# Patient Record
Sex: Male | Born: 1960
Health system: Southern US, Community
[De-identification: ages and names within clinical notes are randomized; demographics above are authoritative.]

## PROBLEM LIST (undated history)

## (undated) DIAGNOSIS — E119 Type 2 diabetes mellitus without complications: Secondary | ICD-10-CM

## (undated) DIAGNOSIS — I10 Essential (primary) hypertension: Secondary | ICD-10-CM

## (undated) DIAGNOSIS — E785 Hyperlipidemia, unspecified: Secondary | ICD-10-CM

## (undated) DIAGNOSIS — L219 Seborrheic dermatitis, unspecified: Secondary | ICD-10-CM

## (undated) HISTORY — DX: Essential (primary) hypertension: I10

## (undated) HISTORY — DX: Type 2 diabetes mellitus without complications: E11.9

## (undated) HISTORY — DX: Seborrheic dermatitis, unspecified: L21.9

## (undated) HISTORY — DX: Hyperlipidemia, unspecified: E78.5

---

## 2017-09-13 ENCOUNTER — Other Ambulatory Visit: Payer: Self-pay | Admitting: Internal Medicine

## 2017-09-13 ENCOUNTER — Encounter: Payer: Self-pay | Admitting: Nurse Practitioner

## 2017-09-13 ENCOUNTER — Ambulatory Visit: Payer: Self-pay | Attending: Nurse Practitioner | Admitting: Nurse Practitioner

## 2017-09-13 VITALS — BP 119/73 | HR 84 | Temp 98.2°F | Resp 18 | Ht 73.0 in | Wt 194.2 lb

## 2017-09-13 DIAGNOSIS — E119 Type 2 diabetes mellitus without complications: Secondary | ICD-10-CM

## 2017-09-13 DIAGNOSIS — E785 Hyperlipidemia, unspecified: Secondary | ICD-10-CM

## 2017-09-13 DIAGNOSIS — Z1211 Encounter for screening for malignant neoplasm of colon: Secondary | ICD-10-CM

## 2017-09-13 DIAGNOSIS — Z23 Encounter for immunization: Secondary | ICD-10-CM | POA: Insufficient documentation

## 2017-09-13 LAB — POCT URINALYSIS DIPSTICK
BILIRUBIN UA: NEGATIVE
Glucose, UA: NEGATIVE
KETONES UA: NEGATIVE
LEUKOCYTES UA: NEGATIVE
Nitrite, UA: NEGATIVE
PH UA: 6.5 (ref 5.0–8.0)
Protein, UA: NEGATIVE
RBC UA: NEGATIVE
SPEC GRAV UA: 1.01 (ref 1.010–1.025)
Urobilinogen, UA: 0.2 E.U./dL

## 2017-09-13 LAB — POCT GLYCOSYLATED HEMOGLOBIN (HGB A1C): HEMOGLOBIN A1C: 6.6

## 2017-09-13 LAB — GLUCOSE, POCT (MANUAL RESULT ENTRY): POC GLUCOSE: 150 mg/dL — AB (ref 70–99)

## 2017-09-13 MED ORDER — GLIMEPIRIDE 1 MG PO TABS: 1.0000 mg | ORAL_TABLET | Freq: Every day | ORAL | 0 refills | Status: DC

## 2017-09-13 MED ORDER — METFORMIN HCL 500 MG PO TABS: 500.0000 mg | ORAL_TABLET | Freq: Every day | ORAL | 1 refills | Status: DC

## 2017-09-13 MED ORDER — ATORVASTATIN CALCIUM 10 MG PO TABS: 10.0000 mg | ORAL_TABLET | Freq: Every day | ORAL | 1 refills | Status: DC

## 2017-09-13 MED FILL — ?ATORVASTATIN 10 MG TABLET: 10 | 30 days supply | Qty: 30 | Fill #0

## 2017-09-13 MED FILL — GLIMEPIRIDE 1 MG TAB: 1 | 30 days supply | Qty: 30 | Fill #0

## 2017-09-13 MED FILL — ?METFORMIN HCL 500MG TABLET: 500 | 30 days supply | Qty: 30 | Fill #0

## 2017-09-13 NOTE — Patient Instructions (Addendum)

## 2017-09-13 NOTE — Progress Notes (Signed)
Assessment & Plan:  Carl Hendricks was seen today for new patient (initial visit).  Diagnoses and all orders for this visit:  Type 2 diabetes mellitus without complication, without long-term current use of insulin (HCC) -     Glucose (CBG) -     HgB A1c -     CBC -     CMP14+EGFR -     Microalbumin / creatinine urine ratio -     Urinalysis Dipstick -       Ambulatory referral to Ophthalmology -     glimepiride (AMARYL) 1 MG tablet; Take 1 tablet (1 mg total) by mouth daily with breakfast. -     metFORMIN (GLUCOPHAGE) 500 MG tablet; Take 1 tablet (500 mg total) by mouth daily with breakfast.  Hyperlipidemia, unspecified hyperlipidemia type -     Lipid panel -     atorvastatin (LIPITOR) 10 MG tablet; Take 1 tablet (10 mg total) by mouth daily. Exercise at least 150 minutes per day. Work on a low fat, heart healthy diet and participate in regular aerobic exercise program to control as well.  Needs flu shot -     Flu Vaccine QUAD 6+ mos PF IM (Fluarix Quad PF)    Patient has been counseled on age-appropriate routine health concerns for screening and prevention. These are reviewed and up-to-date. Referrals have been placed accordingly. Immunizations are up-to-date or declined.    Subjective:   Chief Complaint  Patient presents with  . New Patient (Initial Visit)    Patient stated that he have diabetes. Patient stated that he used to take medication for diabetes, cholesterol. Patient would also like to get medication for his condition.    HPI Carl Hendricks 56 y.o. male presents to office today to establish care as a new patient.  He is from [place] and moved to the [place] 4 months ago. He is accompanied by his wife today. He reports a history of hyperlipidemia and diabetes mellitus. He denies a history of HTN. He endorses taking metformin, amaryl and "a medicine for my kidneys". His native language is arabic and he is unable to recall the [place] version of the medication he was taking for his  kidneys. As I am not sure if this was a medication for a UTI; I will add on a UA today.   Hyperlipidemia:  This is a longstanding condition for the patient. He currently denies  chest pain, dyspnea, exertional chest pressure/discomfort, fatigue, lower extremity edema, palpitations, poor exercise tolerance and syncope. There is not a family history of hyperlipidemia. There is not a family history of early ischemia heart disease.   Diabetes Mellitus Patient has been out of his metformin and amaryl for several months. He checks his blood sugars daily. Denies hypoglycemia or hyperglycemia. He "walks a lot". Endorses weight loss but unsure how much. Tries to stay active and eat healthy. He has not seen an eye doctor to evaluate for retinopathy. He denies any visual disturbances.  Lab Results  Component Value Date   HGBA1C 6.6 09/13/2017      Review of Systems  Constitutional: Negative for fever, malaise/fatigue and weight loss.  HENT: Negative.  Negative for nosebleeds.   Eyes: Negative.  Negative for blurred vision, double vision and photophobia.  Respiratory: Negative.  Negative for cough and shortness of breath.   Cardiovascular: Negative.  Negative for chest pain, palpitations and leg swelling.  Gastrointestinal: Negative.  Negative for abdominal pain, constipation, diarrhea, heartburn, nausea and vomiting.  Musculoskeletal: Negative.  Negative  for myalgias.  Neurological: Negative.  Negative for dizziness, focal weakness, seizures and headaches.  Endo/Heme/Allergies: Negative for environmental allergies.  Psychiatric/Behavioral: Negative.  Negative for suicidal ideas.    History reviewed. No pertinent past medical history.  History reviewed. No pertinent surgical history.  Family History  Problem Relation Age of Onset  . Diabetes Mother   . Diabetes Father   . Diabetes Sister     Social History Reviewed with no changes to be made today.   No outpatient medications prior to  visit.   No facility-administered medications prior to visit.     No Known Allergies     Objective:    BP 119/73 (BP Location: Left Arm, Patient Position: Sitting, Cuff Size: Normal)   Pulse 84   Temp 98.2 F (36.8 C) (Oral)   Resp 18   Ht 6' 1" (1.854 m)   Wt 194 lb 3.2 oz (88.1 kg)   SpO2 99%   BMI 25.62 kg/m  Wt Readings from Last 3 Encounters:  09/13/17 194 lb 3.2 oz (88.1 kg)    Physical Exam  Constitutional: He is oriented to person, place, and time. He appears well-developed and well-nourished. He is cooperative.  HENT:  Head: Normocephalic and atraumatic.  Eyes: EOM are normal.  Neck: Normal range of motion.  Cardiovascular: Normal rate, regular rhythm, normal heart sounds and intact distal pulses. Exam reveals no gallop and no friction rub.  No murmur heard. Pulmonary/Chest: Effort normal and breath sounds normal. No tachypnea. No respiratory distress. He has no decreased breath sounds. He has no wheezes. He has no rhonchi. He has no rales. He exhibits no tenderness.  Abdominal: Soft. Bowel sounds are normal.  Musculoskeletal: Normal range of motion. He exhibits no edema.  Neurological: He is alert and oriented to person, place, and time. Coordination normal.  Skin: Skin is warm and dry.  Psychiatric: He has a normal mood and affect. His behavior is normal. Judgment and thought content normal.  Nursing note and vitals reviewed.   Patient has been counseled extensively about nutrition and exercise as well as the importance of adherence with medications and regular follow-up. The patient was given clear instructions to go to ER or return to medical center if symptoms don't improve, worsen or new problems develop. The patient verbalized understanding.   Follow-up: Return in about 3 months (around 12/14/2017) for HPL/DM.   Gildardo Pounds, FNP-BC [facility] and [facility] [place], [place]   09/13/2017, 11:37 AM

## 2017-09-14 LAB — CMP14+EGFR
ALBUMIN: 4.1 g/dL (ref 3.5–5.5)
ALK PHOS: 96 IU/L (ref 39–117)
ALT: 21 IU/L (ref 0–44)
AST: 23 IU/L (ref 0–40)
Albumin/Globulin Ratio: 1.3 (ref 1.2–2.2)
BUN / CREAT RATIO: 14 (ref 9–20)
BUN: 15 mg/dL (ref 6–24)
Bilirubin Total: 0.4 mg/dL (ref 0.0–1.2)
CALCIUM: 9.8 mg/dL (ref 8.7–10.2)
CO2: 25 mmol/L (ref 20–29)
CREATININE: 1.09 mg/dL (ref 0.76–1.27)
Chloride: 100 mmol/L (ref 96–106)
GFR calc Af Amer: 87 mL/min/{1.73_m2} (ref >59)
GFR, EST NON AFRICAN AMERICAN: 75 mL/min/{1.73_m2} (ref >59)
GLOBULIN, TOTAL: 3.1 g/dL (ref 1.5–4.5)
GLUCOSE: 121 mg/dL — AB (ref 65–99)
Potassium: 4 mmol/L (ref 3.5–5.2)
SODIUM: 140 mmol/L (ref 134–144)
Total Protein: 7.2 g/dL (ref 6.0–8.5)

## 2017-09-14 LAB — CBC
HEMATOCRIT: 45.5 % (ref 37.5–51.0)
HEMOGLOBIN: 15.5 g/dL (ref 13.0–17.7)
MCH: 29.8 pg (ref 26.6–33.0)
MCHC: 34.1 g/dL (ref 31.5–35.7)
MCV: 87 fL (ref 79–97)
Platelets: 179 10*3/uL (ref 150–379)
RBC: 5.21 x10E6/uL (ref 4.14–5.80)
RDW: 13.2 % (ref 12.3–15.4)
WBC: 7.7 10*3/uL (ref 3.4–10.8)

## 2017-09-14 LAB — LIPID PANEL
CHOL/HDL RATIO: 5.2 ratio — AB (ref 0.0–5.0)
CHOLESTEROL TOTAL: 156 mg/dL (ref 100–199)
HDL: 30 mg/dL — ABNORMAL LOW (ref >39)
LDL CALC: 94 mg/dL (ref 0–99)
TRIGLYCERIDES: 160 mg/dL — AB (ref 0–149)
VLDL CHOLESTEROL CAL: 32 mg/dL (ref 5–40)

## 2017-09-14 LAB — MICROALBUMIN / CREATININE URINE RATIO
Creatinine, Urine: 99.7 mg/dL
MICROALBUM., U, RANDOM: 19.1 ug/mL
Microalb/Creat Ratio: 19.2 mg/g creat (ref 0.0–30.0)

## 2017-09-19 ENCOUNTER — Telehealth: Payer: Self-pay

## 2017-09-19 NOTE — Telephone Encounter (Signed)
Patient informed on lab result. Patient is aware to work on heart healthy diet and exercise.   Patient verified DOB.

## 2017-09-19 NOTE — Telephone Encounter (Signed)
-----   Message from Zelda W Fleming, NP sent at 09/18/2017  8:33 PM EST ----- Labs are essentially normal. Continue to take all your medications as prescribed. Continue to work on low fat, heart healthy diet and participate in regular aerobic exercise program to control as well exercising 150 minutes per week. 

## 2017-09-19 NOTE — Telephone Encounter (Signed)
-----   Message from Claiborne RiggZelda W Fleming, NP sent at 09/18/2017  8:33 PM EST ----- Labs are essentially normal. Continue to take all your medications as prescribed. Continue to work on low fat, heart healthy diet and participate in regular aerobic exercise program to control as well exercising 150 minutes per week.

## 2017-09-26 ENCOUNTER — Ambulatory Visit: Payer: Self-pay | Attending: Nurse Practitioner

## 2017-10-18 MED FILL — GLIMEPIRIDE 1 MG TABLET: 1 | 30 days supply | Qty: 30 | Fill #1

## 2017-10-18 MED FILL — ?METFORMIN HCL 500MG TABLET: 500 | 30 days supply | Qty: 30 | Fill #1

## 2017-10-18 MED FILL — ?ATORVASTATIN 10 MG TABLET: 10 | 30 days supply | Qty: 30 | Fill #1

## 2017-12-15 ENCOUNTER — Ambulatory Visit: Payer: Self-pay | Admitting: Nurse Practitioner

## 2017-12-15 MED FILL — ?ATORVASTATIN 10 MG TABLET: 10 | 30 days supply | Qty: 30 | Fill #2

## 2017-12-15 MED FILL — GLIMEPIRIDE 1 MG TABLET: 1 | 30 days supply | Qty: 30 | Fill #2

## 2017-12-15 MED FILL — metFORMIN HCL 500 MG TABS: 500 | 30 days supply | Qty: 30 | Fill #2

## 2018-01-12 ENCOUNTER — Other Ambulatory Visit: Payer: Self-pay | Admitting: Nurse Practitioner

## 2018-01-12 ENCOUNTER — Encounter: Payer: Self-pay | Admitting: Nurse Practitioner

## 2018-01-12 ENCOUNTER — Ambulatory Visit: Payer: Self-pay | Attending: Nurse Practitioner | Admitting: Nurse Practitioner

## 2018-01-12 DIAGNOSIS — E119 Type 2 diabetes mellitus without complications: Secondary | ICD-10-CM | POA: Insufficient documentation

## 2018-01-12 DIAGNOSIS — Z7984 Long term (current) use of oral hypoglycemic drugs: Secondary | ICD-10-CM | POA: Insufficient documentation

## 2018-01-12 DIAGNOSIS — E785 Hyperlipidemia, unspecified: Secondary | ICD-10-CM | POA: Insufficient documentation

## 2018-01-12 DIAGNOSIS — Z79899 Other long term (current) drug therapy: Secondary | ICD-10-CM | POA: Insufficient documentation

## 2018-01-12 LAB — GLUCOSE, POCT (MANUAL RESULT ENTRY): POC Glucose: 175 mg/dl — AB (ref 70–99)

## 2018-01-12 MED ORDER — GLIMEPIRIDE 1 MG PO TABS: 1.0000 mg | ORAL_TABLET | Freq: Every day | ORAL | 0 refills | Status: DC

## 2018-01-12 MED ORDER — ATORVASTATIN CALCIUM 10 MG PO TABS: 10.0000 mg | ORAL_TABLET | Freq: Every day | ORAL | 1 refills | Status: DC

## 2018-01-12 MED ORDER — METFORMIN HCL 500 MG PO TABS: 500.0000 mg | ORAL_TABLET | Freq: Every day | ORAL | 1 refills | Status: DC

## 2018-01-12 MED FILL — GLIMEPIRIDE 1 MG TABLET: 1 | 30 days supply | Qty: 30 | Fill #0

## 2018-01-12 MED FILL — metFORMIN HCL 500 MG TABS: 500 | 30 days supply | Qty: 30 | Fill #0

## 2018-01-12 MED FILL — ATORVASTATIN 10 MG TABLET: 10 | 30 days supply | Qty: 30 | Fill #3

## 2018-01-12 NOTE — Progress Notes (Signed)
Assessment & Plan:  Carl Hendricks was seen today for follow-up and medication refill.  Diagnoses and all orders for this visit:  Type 2 diabetes mellitus without complication, without long-term current use of insulin (HCC) -     Glucose (CBG) -     glimepiride (AMARYL) 1 MG tablet; Take 1 tablet (1 mg total) by mouth daily with breakfast. -     metFORMIN (GLUCOPHAGE) 500 MG tablet; Take 1 tablet (500 mg total) by mouth daily with breakfast. Continue blood sugar control as discussed in office today, low carbohydrate diet, and regular physical exercise as tolerated, 150 minutes per week (30 min each day, 5 days per week, or 50 min 3 days per week). Keep blood sugar logs with fasting goal of 80-130 mg/dl, post prandial less than 180.  For Hypoglycemia: BS <60 and Hyperglycemia BS >400; contact the clinic ASAP. Annual eye exams and foot exams are recommended.   Hyperlipidemia, unspecified hyperlipidemia type -     atorvastatin (LIPITOR) 10 MG tablet; Take 1 tablet (10 mg total) by mouth daily. Work on a low fat, heart healthy diet and participate in regular aerobic exercise program to control as well by working out at least 150 minutes per week. No fried foods. No junk foods, sodas, sugary drinks, unhealthy snacking, or smoking.   Patient has been counseled on age-appropriate routine health concerns for screening and prevention. These are reviewed and up-to-date. Referrals have been placed accordingly. Immunizations are up-to-date or declined.    Subjective:   Chief Complaint  Patient presents with  . Follow-up    Pt. is here for diabetes and cholesterol follow-up.  . Medication Refill   HPI Carl Hendricks 57 y.o. male presents to office today for diabetes follow up.    DIABETES MELLITUS TYPE 2 Chronic. Stable. Glucose elevated today however he did eat prior to coming. Checking his blood sugars daily. Still exercising. He endorses medication compliance taking metformin and glimepiride. Denies  any symptoms of hypo or hyperglycemia at this time.  Lab Results  Component Value Date   HGBA1C 6.6 09/13/2017   HYPERLIPIDEMIA Chronic. Stable. Endorses medication compliance. Taking atorvastatin 10mg  daily. Denies statin intolerance or myalgias.   Lab Results  Component Value Date   LDLCALC 94 09/13/2017   Review of Systems  Constitutional: Negative for fever, malaise/fatigue and weight loss.  HENT: Negative.  Negative for nosebleeds.   Eyes: Negative.  Negative for blurred vision, double vision and photophobia.  Respiratory: Negative.  Negative for cough and shortness of breath.   Cardiovascular: Negative.  Negative for chest pain, palpitations and leg swelling.  Gastrointestinal: Negative.  Negative for heartburn, nausea and vomiting.  Musculoskeletal: Negative.  Negative for myalgias.  Neurological: Negative.  Negative for dizziness, focal weakness, seizures and headaches.  Psychiatric/Behavioral: Negative.  Negative for suicidal ideas.    Past Medical History:  Diagnosis Date  . Diabetes mellitus without complication (HCC)   . Hyperlipidemia     History reviewed. No pertinent surgical history.  Family History  Problem Relation Age of Onset  . Diabetes Mother   . Diabetes Father   . Diabetes Sister     Social History Reviewed with no changes to be made today.   Outpatient Medications Prior to Visit  Medication Sig Dispense Refill  . atorvastatin (LIPITOR) 10 MG tablet Take 1 tablet (10 mg total) by mouth daily. 90 tablet 1  . glimepiride (AMARYL) 1 MG tablet Take 1 tablet (1 mg total) by mouth daily with breakfast. 90 tablet  0  . metFORMIN (GLUCOPHAGE) 500 MG tablet Take 1 tablet (500 mg total) by mouth daily with breakfast. 90 tablet 1   No facility-administered medications prior to visit.     No Known Allergies     Objective:    BP (!) 147/88 (BP Location: Left Arm, Patient Position: Sitting, Cuff Size: Normal)   Pulse 87   Temp 98.2 F (36.8 C) (Oral)    Wt 195 lb 12.8 oz (88.8 kg)   SpO2 97%   BMI 25.83 kg/m  Wt Readings from Last 3 Encounters:  01/12/18 195 lb 12.8 oz (88.8 kg)  09/13/17 194 lb 3.2 oz (88.1 kg)    Physical Exam  Constitutional: He is oriented to person, place, and time. He appears well-developed and well-nourished. He is cooperative.  HENT:  Head: Normocephalic and atraumatic.  Eyes: EOM are normal.  Neck: Normal range of motion.  Cardiovascular: Normal rate, regular rhythm and normal heart sounds. Exam reveals no gallop and no friction rub.  No murmur heard. Pulmonary/Chest: Effort normal and breath sounds normal. No tachypnea. No respiratory distress. He has no decreased breath sounds. He has no wheezes. He has no rhonchi. He has no rales. He exhibits no tenderness.  Abdominal: Soft. Bowel sounds are normal.  Musculoskeletal: Normal range of motion. He exhibits no edema.  Neurological: He is alert and oriented to person, place, and time. Coordination normal.  Skin: Skin is warm and dry.  Psychiatric: He has a normal mood and affect. His behavior is normal. Judgment and thought content normal.  Nursing note and vitals reviewed.     Patient has been counseled extensively about nutrition and exercise as well as the importance of adherence with medications and regular follow-up. The patient was given clear instructions to go to ER or return to medical center if symptoms don't improve, worsen or new problems develop. The patient verbalized understanding.   Follow-up: Return in about 2 months (around 03/14/2018) for DM.   Claiborne Rigg, FNP-BC [facility] and [facility] [place], [place] [phone]   01/13/2018, 11:09 PM

## 2018-01-13 ENCOUNTER — Encounter: Payer: Self-pay | Admitting: Nurse Practitioner

## 2018-02-13 MED FILL — metFORMIN HCL 500 MG TABS: 500 | 30 days supply | Qty: 30 | Fill #1

## 2018-02-13 MED FILL — GLIMEPIRIDE 1 MG TABLET: 1 | 30 days supply | Qty: 30 | Fill #1

## 2018-02-13 MED FILL — ATORVASTATIN 10 MG TABLET: 10 | 30 days supply | Qty: 30 | Fill #4

## 2018-03-06 ENCOUNTER — Ambulatory Visit: Payer: Self-pay | Attending: Family Medicine

## 2018-03-21 MED FILL — GLIMEPIRIDE 1 MG TABLET: 1 | 30 days supply | Qty: 30 | Fill #2

## 2018-03-21 MED FILL — metFORMIN HCL 500 MG TABS: 500 | 30 days supply | Qty: 30 | Fill #2

## 2018-03-21 MED FILL — ATORVASTATIN 10 MG TABLET: 10 | 30 days supply | Qty: 30 | Fill #5

## 2018-04-16 MED FILL — ?ATORVASTATIN 10MG TABLET: 10 | 30 days supply | Qty: 30 | Fill #0

## 2018-04-16 MED FILL — ?METFORMIN HCL 500MG TABLET: 500 | 30 days supply | Qty: 30 | Fill #3

## 2018-04-16 MED FILL — GLIMEPIRIDE 1 MG TABLET: 1 | 30 days supply | Qty: 30 | Fill #0

## 2018-05-11 MED FILL — ?METFORMIN HCL 500MG TABS: 500 | 30 days supply | Qty: 30 | Fill #4

## 2018-05-11 MED FILL — GLIMEPIRIDE 1 MG TABLET: 1 | 30 days supply | Qty: 30 | Fill #1

## 2018-05-11 MED FILL — ?ATORVASTATIN 10MG TABLET: 10 | 30 days supply | Qty: 30 | Fill #1

## 2018-05-30 ENCOUNTER — Encounter

## 2018-06-15 MED FILL — GLIMEPIRIDE 1 MG TABLET: 1 | 30 days supply | Qty: 30 | Fill #2

## 2018-06-15 MED FILL — ?ATORVASTATIN 10MG TABLET: 10 | 30 days supply | Qty: 30 | Fill #2

## 2018-06-15 MED FILL — ?METFORMIN HCL 500MG TABS: 500 | 30 days supply | Qty: 30 | Fill #5

## 2018-06-22 ENCOUNTER — Ambulatory Visit: Payer: Self-pay | Attending: Family Medicine

## 2018-07-14 ENCOUNTER — Other Ambulatory Visit: Payer: Self-pay

## 2018-07-14 ENCOUNTER — Emergency Department (HOSPITAL_COMMUNITY)
Admission: EM | Admit: 2018-07-14 | Discharge: 2018-07-14 | Disposition: A | Discharge status: Home / Self Care | Payer: Self-pay | Attending: Emergency Medicine | Admitting: Emergency Medicine

## 2018-07-14 ENCOUNTER — Encounter (HOSPITAL_COMMUNITY): Payer: Self-pay | Admitting: Emergency Medicine

## 2018-07-14 DIAGNOSIS — E785 Hyperlipidemia, unspecified: Secondary | ICD-10-CM | POA: Insufficient documentation

## 2018-07-14 DIAGNOSIS — Z79899 Other long term (current) drug therapy: Secondary | ICD-10-CM | POA: Insufficient documentation

## 2018-07-14 DIAGNOSIS — R69 Illness, unspecified: Secondary | ICD-10-CM

## 2018-07-14 DIAGNOSIS — Z7984 Long term (current) use of oral hypoglycemic drugs: Secondary | ICD-10-CM | POA: Insufficient documentation

## 2018-07-14 DIAGNOSIS — E119 Type 2 diabetes mellitus without complications: Secondary | ICD-10-CM | POA: Insufficient documentation

## 2018-07-14 DIAGNOSIS — J111 Influenza due to unidentified influenza virus with other respiratory manifestations: Secondary | ICD-10-CM | POA: Insufficient documentation

## 2018-07-14 LAB — COMPREHENSIVE METABOLIC PANEL
ALK PHOS: 64 U/L (ref 38–126)
ALT: 24 U/L (ref 0–44)
AST: 23 U/L (ref 15–41)
Albumin: 3.6 g/dL (ref 3.5–5.0)
Anion gap: 9 (ref 5–15)
BUN: 18 mg/dL (ref 6–20)
CO2: 24 mmol/L (ref 22–32)
CREATININE: 1.48 mg/dL — AB (ref 0.61–1.24)
Calcium: 9.3 mg/dL (ref 8.9–10.3)
Chloride: 104 mmol/L (ref 98–111)
GFR, EST AFRICAN AMERICAN: 59 mL/min — AB (ref >60)
GFR, EST NON AFRICAN AMERICAN: 51 mL/min — AB (ref >60)
Glucose, Bld: 82 mg/dL (ref 70–99)
Potassium: 4.1 mmol/L (ref 3.5–5.1)
Sodium: 137 mmol/L (ref 135–145)
TOTAL PROTEIN: 6.8 g/dL (ref 6.5–8.1)
Total Bilirubin: 1.5 mg/dL — ABNORMAL HIGH (ref 0.3–1.2)

## 2018-07-14 LAB — CBC WITH DIFFERENTIAL/PLATELET
Abs Immature Granulocytes: 0.1 10*3/uL (ref 0.0–0.1)
BASOS ABS: 0.1 10*3/uL (ref 0.0–0.1)
BASOS PCT: 0 %
EOS ABS: 0.1 10*3/uL (ref 0.0–0.7)
EOS PCT: 1 %
HEMATOCRIT: 44.7 % (ref 39.0–52.0)
Hemoglobin: 15.1 g/dL (ref 13.0–17.0)
Immature Granulocytes: 0 %
LYMPHS ABS: 1.2 10*3/uL (ref 0.7–4.0)
Lymphocytes Relative: 8 %
MCH: 30.5 pg (ref 26.0–34.0)
MCHC: 33.8 g/dL (ref 30.0–36.0)
MCV: 90.3 fL (ref 78.0–100.0)
Monocytes Absolute: 0.8 10*3/uL (ref 0.1–1.0)
Monocytes Relative: 5 %
NEUTROS PCT: 86 %
Neutro Abs: 13 10*3/uL — ABNORMAL HIGH (ref 1.7–7.7)
PLATELETS: 147 10*3/uL — AB (ref 150–400)
RBC: 4.95 MIL/uL (ref 4.22–5.81)
RDW: 13.1 % (ref 11.5–15.5)
WBC: 15.2 10*3/uL — AB (ref 4.0–10.5)

## 2018-07-14 LAB — URINALYSIS, ROUTINE W REFLEX MICROSCOPIC
BILIRUBIN URINE: NEGATIVE
Glucose, UA: NEGATIVE mg/dL
Hgb urine dipstick: NEGATIVE
Ketones, ur: NEGATIVE mg/dL
LEUKOCYTES UA: NEGATIVE
NITRITE: NEGATIVE
Protein, ur: NEGATIVE mg/dL
Specific Gravity, Urine: 1.018 (ref 1.005–1.030)
pH: 7 (ref 5.0–8.0)

## 2018-07-14 LAB — I-STAT CG4 LACTIC ACID, ED: Lactic Acid, Venous: 1.11 mmol/L (ref 0.5–1.9)

## 2018-07-14 MED ORDER — IBUPROFEN 600 MG PO TABS: 600.0000 mg | ORAL_TABLET | Freq: Four times a day (QID) | ORAL | 0 refills | Status: DC | PRN

## 2018-07-14 MED ORDER — IBUPROFEN 800 MG PO TABS
800.0000 mg | ORAL_TABLET | Freq: Once | ORAL | Status: AC
  Administered 2018-07-14: 800 mg via ORAL
  Filled 2018-07-14: qty 1

## 2018-07-14 MED ORDER — ACETAMINOPHEN 325 MG PO TABS
650.0000 mg | ORAL_TABLET | Freq: Once | ORAL | Status: AC | PRN
  Administered 2018-07-14: 650 mg via ORAL
  Filled 2018-07-14: qty 2

## 2018-07-14 MED ORDER — ACETAMINOPHEN 500 MG PO TABS: 1000.0000 mg | ORAL_TABLET | Freq: Four times a day (QID) | ORAL | 0 refills | Status: DC | PRN

## 2018-07-14 NOTE — ED Notes (Signed)
Patient using restroom in attempt to give urine at this time.

## 2018-07-14 NOTE — Discharge Instructions (Signed)
1.  Take acetaminophen (Tylenol) 1000 mg every 6 hours for fever or body ache.  If you need additional pain medication or fever medication, you may take ibuprofen 600 mg every 8 hours.  Take plenty of fluids and stay hydrated. 2.  See your family doctor on Monday for recheck. 3.  Return to the emergency department if any of your symptoms are worsening or you are not improving in the next couple of days.

## 2018-07-14 NOTE — ED Triage Notes (Signed)
Pt presents with fever x 2 days with headache, denies cough, urinary symptoms, n/v/d

## 2018-07-14 NOTE — ED Notes (Signed)
Patient given discharge instructions and verbalized understanding.  Patient stable to discharge at this time.  Patient is alert and oriented to baseline.  No distressed noted at this time.  All belongings taken with the patient at discharge.   

## 2018-07-14 NOTE — ED Provider Notes (Signed)
[facility] Provider Note   CSN: [id number] Arrival date & time: 07/14/18  1558     History   Chief Complaint Chief Complaint  Patient presents with  . Fever  . Headache    HPI Carl Hendricks is a 57 y.o. male.  HPI Reports symptoms started last night.  He started with a headache and aching around his shoulders with some sweats and chills.  He denies any nausea or vomiting.  No sore throat or earache.  He reports he was aching in his joints some.  No abdominal pain no diarrhea no urinary symptoms.  Patient reports he feels much better today.  Symptoms were worse last night.  Patient is diabetic.  He reports he works at [employer].  He reports he may have sick contact at work.  He reports he does have school-aged children but they are not sick.  It is from [place].  He reports that the last time he traveled out of the country or greens were area was over a year ago.  He reports he does get outside, he is not sure if he had any mosquito or tick bites.  Is not had any rashes or lesions on the skin.  Patient denies any alcohol or tobacco use.  No drug use.  He reports he has a family doctor that manages his diabetes. Past Medical History:  Diagnosis Date  . Diabetes mellitus without complication (HCC)   . Hyperlipidemia     Patient Active Problem List   Diagnosis Date Noted  . Diabetes mellitus without complication (HCC) 01/12/2018    History reviewed. No pertinent surgical history.      Home Medications    Prior to Admission medications   Medication Sig Start Date End Date Taking? Authorizing Provider  acetaminophen (TYLENOL) 500 MG tablet Take 2 tablets (1,000 mg total) by mouth every 6 (six) hours as needed. 07/14/18   Arby BarrettePfeiffer, Korby Ratay, MD  atorvastatin (LIPITOR) 10 MG tablet Take 1 tablet (10 mg total) by mouth daily. 01/12/18 04/12/18  Claiborne RiggFleming, Zelda W, NP  glimepiride (AMARYL) 1 MG tablet Take 1 tablet (1 mg total) by mouth daily  with breakfast. 01/12/18 04/12/18  Claiborne RiggFleming, Zelda W, NP  ibuprofen (ADVIL,MOTRIN) 600 MG tablet Take 1 tablet (600 mg total) by mouth every 6 (six) hours as needed. 07/14/18   Arby BarrettePfeiffer, Rhydian Baldi, MD  metFORMIN (GLUCOPHAGE) 500 MG tablet Take 1 tablet (500 mg total) by mouth daily with breakfast. 01/12/18 02/11/18  Claiborne RiggFleming, Zelda W, NP    Family History Family History  Problem Relation Age of Onset  . Diabetes Mother   . Diabetes Father   . Diabetes Sister     Social History Social History   Tobacco Use  . Smoking status: Never Smoker  . Smokeless tobacco: Never Used  Substance Use Topics  . Alcohol use: No    Frequency: Never  . Drug use: No     Allergies   Patient has no known allergies.   Review of Systems Review of Systems 10 Systems reviewed and are negative for acute change except as noted in the HPI.   Physical Exam Updated Vital Signs BP 129/74 (BP Location: Right Arm)   Pulse 89   Temp 99.2 F (37.3 C) (Oral)   Resp 18   Ht 6' (1.829 m)   Wt 84 kg   SpO2 99%   BMI 25.12 kg/m   Physical Exam  Constitutional: He is oriented to person, place, and time.  He appears well-developed and well-nourished. No distress.  HENT:  Head: Normocephalic and atraumatic.  Posterior oropharynx widely patent.  No tonsillar erythema or exudate.  Patient has had dental work and has dentition in fair condition.  Lateral TMs normal.  Eyes: Pupils are equal, round, and reactive to light. EOM are normal.  Neck: Neck supple.  No meningismus.  Cardiovascular: Normal rate, regular rhythm, normal heart sounds and intact distal pulses.  Pulmonary/Chest: Effort normal and breath sounds normal.  Abdominal: Soft. Bowel sounds are normal. He exhibits no distension. There is no tenderness.  Musculoskeletal: Normal range of motion. He exhibits no edema or tenderness.  No red joints or effusions.No peripheral edema.  Skin condition of lower legs, ankles and feet is good without any wounds    Neurological: He is alert and oriented to person, place, and time. He has normal strength. He exhibits normal muscle tone. Coordination normal. GCS eye subscore is 4. GCS verbal subscore is 5. GCS motor subscore is 6.  Skin: Skin is warm, dry and intact.  Psychiatric: He has a normal mood and affect.     ED Treatments / Results  Labs (all labs ordered are listed, but only abnormal results are displayed) Labs Reviewed  COMPREHENSIVE METABOLIC PANEL - Abnormal; Notable for the following components:      Result Value   Creatinine, Ser 1.48 (*)    Total Bilirubin 1.5 (*)    GFR calc non Af Amer 51 (*)    GFR calc Af Amer 59 (*)    All other components within normal limits  CBC WITH DIFFERENTIAL/PLATELET - Abnormal; Notable for the following components:   WBC 15.2 (*)    Platelets 147 (*)    Neutro Abs 13.0 (*)    All other components within normal limits  URINALYSIS, ROUTINE W REFLEX MICROSCOPIC  I-STAT CG4 LACTIC ACID, ED  I-STAT CG4 LACTIC ACID, ED    EKG None  Radiology No results found.  Procedures Procedures (including critical care time)  Medications Ordered in ED Medications  ibuprofen (ADVIL,MOTRIN) tablet 800 mg (has no administration in time range)  acetaminophen (TYLENOL) tablet 650 mg (650 mg Oral Given 07/14/18 1615)     Initial Impression / Assessment and Plan / ED Course  I have reviewed the triage vital signs and the nursing notes.  Pertinent labs & imaging results that were available during my care of the patient were reviewed by me and considered in my medical decision making (see chart for details).     Final Clinical Impressions(s) / ED Diagnoses   Final diagnoses:  Influenza-like illness   Patient has generalized constitutional symptoms with low-grade fever.  Symptoms started yesterday evening.  He reports he feels better today.  Patient does not have meningismus.  He has well-controlled diabetes and is compliant with medications.  No lactic  acidosis.  No signs of UTI.  Patient is unsure of any possible mosquito or tick bite but at this time, based on labs and no objective findings I have lower suspicion for zoonotic infection.  Most suspect viral illness.  Patient is counseled on taking ibuprofen and Tylenol for fever and body ache.  Return precautions are reviewed.  Patient is instructed to see his PCP on Monday or return to emergency department sooner if any worsening or changing symptoms ED Discharge Orders         Ordered    acetaminophen (TYLENOL) 500 MG tablet  Every 6 hours PRN     07/14/18 1829  ibuprofen (ADVIL,MOTRIN) 600 MG tablet  Every 6 hours PRN,   Status:  Discontinued     07/14/18 1829    ibuprofen (ADVIL,MOTRIN) 600 MG tablet  Every 6 hours PRN     07/14/18 1839           Arby Barrette, MD 07/14/18 1845

## 2018-07-17 ENCOUNTER — Other Ambulatory Visit: Payer: Self-pay | Admitting: Nurse Practitioner

## 2018-07-17 DIAGNOSIS — E119 Type 2 diabetes mellitus without complications: Secondary | ICD-10-CM

## 2018-07-17 MED FILL — ?METFORMIN HCL 500MG TABS: 500 | 90 days supply | Qty: 90 | Fill #0

## 2018-07-17 MED FILL — GLIMEPIRIDE 1 MG TABLET: 1 | 30 days supply | Qty: 30 | Fill #0

## 2018-07-17 MED FILL — ?ATORVASTATIN 10 MG TABLET: 10 | 30 days supply | Qty: 30 | Fill #3

## 2018-07-30 ENCOUNTER — Ambulatory Visit: Payer: Self-pay | Admitting: Nurse Practitioner

## 2018-08-10 ENCOUNTER — Ambulatory Visit: Payer: Self-pay | Attending: Nurse Practitioner | Admitting: Nurse Practitioner

## 2018-08-10 ENCOUNTER — Encounter: Payer: Self-pay | Admitting: Nurse Practitioner

## 2018-08-10 ENCOUNTER — Ambulatory Visit (HOSPITAL_COMMUNITY)
Admission: RE | Admit: 2018-08-10 | Discharge: 2018-08-10 | Disposition: A | Discharge status: Home / Self Care | Payer: Self-pay | Source: Ambulatory Visit | Attending: Nurse Practitioner | Admitting: Nurse Practitioner

## 2018-08-10 VITALS — BP 145/87 | HR 90 | Temp 99.0°F | Ht 73.0 in | Wt 205.0 lb

## 2018-08-10 DIAGNOSIS — Z23 Encounter for immunization: Secondary | ICD-10-CM | POA: Insufficient documentation

## 2018-08-10 DIAGNOSIS — Z791 Long term (current) use of non-steroidal anti-inflammatories (NSAID): Secondary | ICD-10-CM | POA: Insufficient documentation

## 2018-08-10 DIAGNOSIS — M25562 Pain in left knee: Secondary | ICD-10-CM | POA: Insufficient documentation

## 2018-08-10 DIAGNOSIS — D72829 Elevated white blood cell count, unspecified: Secondary | ICD-10-CM | POA: Insufficient documentation

## 2018-08-10 DIAGNOSIS — Z7984 Long term (current) use of oral hypoglycemic drugs: Secondary | ICD-10-CM | POA: Insufficient documentation

## 2018-08-10 DIAGNOSIS — Z0000 Encounter for general adult medical examination without abnormal findings: Secondary | ICD-10-CM | POA: Insufficient documentation

## 2018-08-10 DIAGNOSIS — Z833 Family history of diabetes mellitus: Secondary | ICD-10-CM | POA: Insufficient documentation

## 2018-08-10 DIAGNOSIS — M79605 Pain in left leg: Secondary | ICD-10-CM | POA: Insufficient documentation

## 2018-08-10 DIAGNOSIS — G8929 Other chronic pain: Secondary | ICD-10-CM | POA: Insufficient documentation

## 2018-08-10 DIAGNOSIS — E785 Hyperlipidemia, unspecified: Secondary | ICD-10-CM | POA: Insufficient documentation

## 2018-08-10 DIAGNOSIS — Z79899 Other long term (current) drug therapy: Secondary | ICD-10-CM | POA: Insufficient documentation

## 2018-08-10 DIAGNOSIS — Z760 Encounter for issue of repeat prescription: Secondary | ICD-10-CM | POA: Insufficient documentation

## 2018-08-10 DIAGNOSIS — E119 Type 2 diabetes mellitus without complications: Secondary | ICD-10-CM | POA: Insufficient documentation

## 2018-08-10 DIAGNOSIS — R030 Elevated blood-pressure reading, without diagnosis of hypertension: Secondary | ICD-10-CM | POA: Insufficient documentation

## 2018-08-10 DIAGNOSIS — Z1211 Encounter for screening for malignant neoplasm of colon: Secondary | ICD-10-CM | POA: Insufficient documentation

## 2018-08-10 LAB — POCT GLYCOSYLATED HEMOGLOBIN (HGB A1C): Hemoglobin A1C: 6.6 % — AB (ref 4.0–5.6)

## 2018-08-10 LAB — GLUCOSE, POCT (MANUAL RESULT ENTRY): POC Glucose: 199 mg/dl — AB (ref 70–99)

## 2018-08-10 MED ORDER — ATORVASTATIN CALCIUM 10 MG PO TABS: 10.0000 mg | ORAL_TABLET | Freq: Every day | ORAL | 1 refills | Status: DC

## 2018-08-10 MED ORDER — METFORMIN HCL 500 MG PO TABS: 500.0000 mg | ORAL_TABLET | Freq: Two times a day (BID) | ORAL | 3 refills | Status: DC

## 2018-08-10 MED ORDER — MELOXICAM 7.5 MG PO TABS: 7.5000 mg | ORAL_TABLET | Freq: Every day | ORAL | 0 refills | Status: DC

## 2018-08-10 MED FILL — ATORVASTATIN 10 MG TABLET: 10 | 90 days supply | Qty: 90 | Fill #0

## 2018-08-10 MED FILL — MELOXICAM 7.5 MG TABLET: 7.5 | 30 days supply | Qty: 30 | Fill #0

## 2018-08-10 NOTE — Progress Notes (Signed)
Assessment & Plan:  Carl Hendricks was seen today for follow-up, medication refill and leg pain.  Diagnoses and all orders for this visit:  Type 2 diabetes mellitus without complication, without long-term current use of insulin (HCC) -     Glucose (CBG) -     HgB A1c -     metFORMIN (GLUCOPHAGE) 500 MG tablet; Take 1 tablet (500 mg total) by mouth 2 (two) times daily with a meal.  Hyperlipidemia, unspecified hyperlipidemia type -     atorvastatin (LIPITOR) 10 MG tablet; Take 1 tablet (10 mg total) by mouth daily. -     Lipid panel  Chronic pain of left knee -     DG Knee Complete 4 Views Left; Future -     meloxicam (MOBIC) 7.5 MG tablet; Take 1 tablet (7.5 mg total) by mouth daily.  Leukocytosis, unspecified type -     CBC -     DG Chest 2 View; Future  Colon cancer screening -     Fecal occult blood, imunochemical([facility])  Routine adult health maintenance -     Hepatitis C Antibody  Need for immunization against influenza -     Flu Vaccine QUAD 36+ mos IM    Patient has been counseled on age-appropriate routine health concerns for screening and prevention. These are reviewed and up-to-date. Referrals have been placed accordingly. Immunizations are up-to-date or declined.    Subjective:   Chief Complaint  Patient presents with  . Follow-up    Pt. is here for follow-up on diabetes. Pt. stated he is only taking Metformin and atorvastatin.   . Medication Refill  . Leg Pain    Pt. stated his left leg is giving him pain with movement.    HPI Carl Hendricks 57 y.o. male presents to office today for follow up of DM and with complaints of chronic left knee pain. VRI was used to communicate directly with patient for the entire encounter including providing detailed patient instructions.   Knee Pain He has complaints of chronic L knee pain. Onset 3 months ago. He denies any injury or trauma. He endorses pain as a stabbing sensation in the medial and lateral meniscal areas.  Pain is aggravated by hyperflexion and prolonged periods of walking. Pain is occurring every day. The knee has not given out or felt unstable. The patient can bend and straighten the knee fully however there is pain endorsed with bending the knee. Treatment to date has been ice, heat, without significant relief.   DM Type 2 Chronic and well controlled. A1c stable at 6.6.  He endorses medication compliance taking metformin 500 mg daily and Amaryl 1 mg daily. At this time for increased compliance will stop Amaryl and refill metformin at 500 mg BID. He denies any GI intolerance with metformin or any hypo or hyperglycemic symptoms. He is overdue for eye exam. Patient has been advised to apply for financial assistance and schedule to see our financial counselor.  Lab Results  Component Value Date   HGBA1C 6.6 (A) 08/10/2018   Elevated Blood Pressure Blood pressure is elevated today. Will recheck at next office visit. We discussed dietary and exercise modifications today.  Last office reading BP was normal. He denies any chest pain, shortness of breath, palpitations, lightheadedness, dizziness, headaches or BLE edema.  BP Readings from Last 3 Encounters:  08/10/18 (!) 145/87  07/14/18 119/74  01/12/18 (!) 147/88   Hyperlipidemia Patient presents for follow up to hyperlipidemia.  He is medication compliant taking atorvastatin  10 mg daily. He is not diet compliant and denies exertional chest pressure/discomfort, feeding intolerance, lower extremity edema, poor exercise tolerance and skin xanthelasma or statin intolerance including myalgias. LDL at goal.  Lab Results  Component Value Date   CHOL 121 08/10/2018   Lab Results  Component Value Date   HDL 36 (L) 08/10/2018   Lab Results  Component Value Date   LDLCALC 52 08/10/2018   Lab Results  Component Value Date   TRIG 166 (H) 08/10/2018   Lab Results  Component Value Date   CHOLHDL 3.4 08/10/2018   ROS  Past Medical History:    Diagnosis Date  . Diabetes mellitus without complication (HCC)   . Hyperlipidemia     History reviewed. No pertinent surgical history.  Family History  Problem Relation Age of Onset  . Diabetes Mother   . Diabetes Father   . Diabetes Sister     Social History Reviewed with no changes to be made today.   Outpatient Medications Prior to Visit  Medication Sig Dispense Refill  . acetaminophen (TYLENOL) 500 MG tablet Take 2 tablets (1,000 mg total) by mouth every 6 (six) hours as needed. 30 tablet 0  . ibuprofen (ADVIL,MOTRIN) 600 MG tablet Take 1 tablet (600 mg total) by mouth every 6 (six) hours as needed. 30 tablet 0  . glimepiride (AMARYL) 1 MG tablet Take 1 tablet (1 mg total) by mouth daily with breakfast. 30 tablet 0  . atorvastatin (LIPITOR) 10 MG tablet Take 1 tablet (10 mg total) by mouth daily. 90 tablet 1  . metFORMIN (GLUCOPHAGE) 500 MG tablet Take 1 tablet (500 mg total) by mouth daily with breakfast. 90 tablet 1   No facility-administered medications prior to visit.    No Known Allergies     Objective:    BP (!) 145/87 (BP Location: Left Arm, Patient Position: Sitting, Cuff Size: Normal)   Pulse 90   Temp 99 F (37.2 C) (Oral)   Ht 6\' 1"  (1.854 m)   Wt 205 lb (93 kg)   SpO2 93%   BMI 27.05 kg/m  Wt Readings from Last 3 Encounters:  08/10/18 205 lb (93 kg)  07/14/18 185 lb 3 oz (84 kg)  01/12/18 195 lb 12.8 oz (88.8 kg)    Physical Exam       Patient has been counseled extensively about nutrition and exercise as well as the importance of adherence with medications and regular follow-up. The patient was given clear instructions to go to ER or return to medical center if symptoms don't improve, worsen or new problems develop. The patient verbalized understanding.   Follow-up: Return in about 3 months (around 11/10/2018) for DM/HTN.   Claiborne Rigg, FNP-BC [facility] [place], [place] [phone]   08/12/2018,  7:54 PM

## 2018-08-11 LAB — LIPID PANEL
CHOLESTEROL TOTAL: 121 mg/dL (ref 100–199)
Chol/HDL Ratio: 3.4 ratio (ref 0.0–5.0)
HDL: 36 mg/dL — ABNORMAL LOW (ref >39)
LDL Calculated: 52 mg/dL (ref 0–99)
Triglycerides: 166 mg/dL — ABNORMAL HIGH (ref 0–149)
VLDL CHOLESTEROL CAL: 33 mg/dL (ref 5–40)

## 2018-08-11 LAB — CBC
HEMATOCRIT: 46.2 % (ref 37.5–51.0)
Hemoglobin: 15.4 g/dL (ref 13.0–17.7)
MCH: 29.1 pg (ref 26.6–33.0)
MCHC: 33.3 g/dL (ref 31.5–35.7)
MCV: 87 fL (ref 79–97)
Platelets: 182 10*3/uL (ref 150–450)
RBC: 5.29 x10E6/uL (ref 4.14–5.80)
RDW: 12.4 % (ref 12.3–15.4)
WBC: 7.9 10*3/uL (ref 3.4–10.8)

## 2018-08-11 LAB — HEPATITIS C ANTIBODY

## 2018-08-12 ENCOUNTER — Encounter: Payer: Self-pay | Admitting: Nurse Practitioner

## 2018-08-14 ENCOUNTER — Telehealth: Payer: Self-pay

## 2018-08-14 NOTE — Telephone Encounter (Signed)
CMA attempt to reach patient to inform on results and Xray results.  No answer and left a VM for a call back.   A letter will be send out to reach patient.

## 2018-08-14 NOTE — Telephone Encounter (Signed)
-----   Message from Claiborne Rigg, NP sent at 08/12/2018  8:06 PM EDT ----- Chest xray and left knee xray are normal. There is no PNA or bronchitis and your knee does not show any fractures.

## 2018-08-14 NOTE — Telephone Encounter (Signed)
-----   Message from Claiborne Rigg, NP sent at 08/12/2018  8:04 PM EDT ----- WBC has decreased and is now normal. Triglycerides continue to be elevated. Continue Lipitor 10 mg daily as prescribed which is your cholesterol lowering medication. Make sure you avoid red meat, fried foods. junk foods, sodas, sugary drinks, unhealthy snacking, alcohol and smoking.  Drink at least 48oz of water per day and work out at least 5 days a week for 30 minutes per day.

## 2018-09-08 LAB — FECAL OCCULT BLOOD, IMMUNOCHEMICAL

## 2018-09-17 ENCOUNTER — Telehealth: Payer: Self-pay

## 2018-09-17 NOTE — Telephone Encounter (Signed)
CMA attempt to reach patient to inform to stop by the office to get another Fecal testing kit.  No answer and left a VM for patient to call back.Marland Kitchen

## 2018-09-17 NOTE — Telephone Encounter (Signed)
-----  Message from Gildardo Pounds, NP sent at 09/10/2018  8:36 PM EST ----- Stool specimen will need to be resent. The stool was not adequate for testing. You can come by the lab and obtain another kit.

## 2018-09-17 NOTE — Addendum Note (Signed)
Addended by: Paschal DoppWHITE,  J on: 09/17/2018 01:47 PM   Modules accepted: Orders

## 2018-09-17 NOTE — Telephone Encounter (Signed)
Patient came to pick up another kit and was instructed to repeat the test.  Patient understood.

## 2018-09-25 ENCOUNTER — Telehealth: Payer: Self-pay | Admitting: Nurse Practitioner

## 2018-09-25 NOTE — Telephone Encounter (Signed)
Patient came in to get their results from their fecal sample they dropped off. Patient was informed he would be called once results are received. Please follow up with patient.

## 2018-09-25 NOTE — Telephone Encounter (Signed)
As of today, [facility] has not received the FIT  specimen.

## 2018-09-27 NOTE — Telephone Encounter (Signed)
Left message on voicemail. Informed patient that he has not placed specimen in the mail, the specimen has not arrived to [facility]. Unable to give results at this time

## 2018-09-28 LAB — FECAL OCCULT BLOOD, IMMUNOCHEMICAL: Fecal Occult Bld: NEGATIVE

## 2018-10-01 MED FILL — GLIMEPIRIDE 1 MG TABLET: 1 | 30 days supply | Qty: 30 | Fill #0

## 2018-10-08 MED FILL — ?METFORMIN HCL 500MG TABLET: 500 | 30 days supply | Qty: 60 | Fill #0

## 2018-10-30 ENCOUNTER — Other Ambulatory Visit: Payer: Self-pay | Admitting: Nurse Practitioner

## 2018-10-30 DIAGNOSIS — E119 Type 2 diabetes mellitus without complications: Secondary | ICD-10-CM

## 2018-11-12 ENCOUNTER — Ambulatory Visit: Payer: Self-pay | Attending: Nurse Practitioner | Admitting: Nurse Practitioner

## 2018-11-12 ENCOUNTER — Encounter: Payer: Self-pay | Admitting: Nurse Practitioner

## 2018-11-12 VITALS — BP 143/86 | HR 86 | Temp 98.5°F | Resp 16 | Ht 72.0 in | Wt 204.2 lb

## 2018-11-12 DIAGNOSIS — I10 Essential (primary) hypertension: Secondary | ICD-10-CM | POA: Insufficient documentation

## 2018-11-12 DIAGNOSIS — Z7984 Long term (current) use of oral hypoglycemic drugs: Secondary | ICD-10-CM | POA: Insufficient documentation

## 2018-11-12 DIAGNOSIS — Z79899 Other long term (current) drug therapy: Secondary | ICD-10-CM | POA: Insufficient documentation

## 2018-11-12 DIAGNOSIS — Z791 Long term (current) use of non-steroidal anti-inflammatories (NSAID): Secondary | ICD-10-CM | POA: Insufficient documentation

## 2018-11-12 DIAGNOSIS — R7989 Other specified abnormal findings of blood chemistry: Secondary | ICD-10-CM

## 2018-11-12 DIAGNOSIS — E785 Hyperlipidemia, unspecified: Secondary | ICD-10-CM | POA: Insufficient documentation

## 2018-11-12 DIAGNOSIS — Z833 Family history of diabetes mellitus: Secondary | ICD-10-CM | POA: Insufficient documentation

## 2018-11-12 DIAGNOSIS — E119 Type 2 diabetes mellitus without complications: Secondary | ICD-10-CM | POA: Insufficient documentation

## 2018-11-12 LAB — GLUCOSE, POCT (MANUAL RESULT ENTRY): POC Glucose: 176 mg/dl — AB (ref 70–99)

## 2018-11-12 MED ORDER — METFORMIN HCL 500 MG PO TABS: 500.0000 mg | ORAL_TABLET | Freq: Two times a day (BID) | ORAL | 1 refills | Status: DC

## 2018-11-12 MED ORDER — GLIMEPIRIDE 2 MG PO TABS: 2.0000 mg | ORAL_TABLET | Freq: Every day | ORAL | 0 refills | Status: DC

## 2018-11-12 MED ORDER — METFORMIN HCL 500 MG PO TABS: 500.0000 mg | ORAL_TABLET | Freq: Two times a day (BID) | ORAL | 3 refills | Status: DC

## 2018-11-12 MED ORDER — ATORVASTATIN CALCIUM 10 MG PO TABS: 10.0000 mg | ORAL_TABLET | Freq: Every day | ORAL | 1 refills | Status: DC

## 2018-11-12 MED ORDER — LISINOPRIL 5 MG PO TABS: 5.0000 mg | ORAL_TABLET | Freq: Every day | ORAL | 3 refills | Status: DC

## 2018-11-12 MED ORDER — METFORMIN HCL 500 MG PO TABS: 500.0000 mg | ORAL_TABLET | Freq: Every day | ORAL | 1 refills | Status: DC

## 2018-11-12 MED FILL — metFORMIN HCL 500 MG TABS: 500 | 90 days supply | Qty: 180 | Fill #0

## 2018-11-12 MED FILL — LISINOPRIL 5 MG TAB: 5 | 90 days supply | Qty: 90 | Fill #0

## 2018-11-12 MED FILL — GLIMEPIRIDE 2 MG TABS: 2 | 90 days supply | Qty: 90 | Fill #0

## 2018-11-12 MED FILL — ATORVASTATIN 10 MG TABLET: 10 | 90 days supply | Qty: 90 | Fill #0

## 2018-11-12 NOTE — Progress Notes (Signed)
Assessment & Plan:  Carl Hendricks was seen today for diabetes.  Diagnoses and all orders for this visit:  Type 2 diabetes mellitus without complication, without long-term current use of insulin (HCC) -     POCT glucose (manual entry) -     Microalbumin / creatinine urine ratio -     Ambulatory referral to Ophthalmology -     Discontinue: metFORMIN (GLUCOPHAGE) 500 MG tablet; Take 1 tablet (500 mg total) by mouth 2 (two) times daily with a meal for 30 days. -     Discontinue: metFORMIN (GLUCOPHAGE) 500 MG tablet; Take 1 tablet (500 mg total) by mouth 2 (two) times daily with a meal. -     glimepiride (AMARYL) 2 MG tablet; Take 1 tablet (2 mg total) by mouth daily before breakfast. -     metFORMIN (GLUCOPHAGE) 500 MG tablet; Take 1 tablet (500 mg total) by mouth daily with breakfast. Continue blood sugar control as discussed in office today, low carbohydrate diet, and regular physical exercise as tolerated, 150 minutes per week (30 min each day, 5 days per week, or 50 min 3 days per week). Keep blood sugar logs with fasting goal of 90-130 mg/dl, post prandial (after you eat) less than 180.  For Hypoglycemia: BS <60 and Hyperglycemia BS >400; contact the clinic ASAP. Annual eye exams and foot exams are recommended.   Essential hypertension -     lisinopril (PRINIVIL,ZESTRIL) 5 MG tablet; Take 1 tablet (5 mg total) by mouth daily. Continue all antihypertensives as prescribed.  Remember to bring in your blood pressure log with you for your follow up appointment.  DASH/Mediterranean Diets are healthier choices for HTN.    Hyperlipidemia, unspecified hyperlipidemia type -     atorvastatin (LIPITOR) 10 MG tablet; Take 1 tablet (10 mg total) by mouth daily. INSTRUCTIONS: Work on a low fat, heart healthy diet and participate in regular aerobic exercise program by working out at least 150 minutes per week; 5 days a week-30 minutes per day. Avoid red meat, fried foods. junk foods, sodas, sugary drinks,  unhealthy snacking, alcohol and smoking.  Drink at least 48oz of water per day and monitor your carbohydrate intake daily.   Elevated serum creatinine -     CMP14+EGFR    Patient has been counseled on age-appropriate routine health concerns for screening and prevention. These are reviewed and up-to-date. Referrals have been placed accordingly. Immunizations are up-to-date or declined.    Subjective:   Chief Complaint  Patient presents with  . Diabetes   HPI Carl Hendricks 58 y.o. male presents to office today for DM and HPL.   DM TYPE 2 Glucose elevated today at 176 fasting. He states the metformin BID has been causing nausea and he would like to return to taking amaryl once a day and metformin 500 mg only once a day. He reports this combination did not cause GI upset. He is not monitoring his blood glucose levels at home. He denies any hypo or hyperglycemic symptoms. He is overdue for eye exam. Patient has been advised to apply for financial assistance and schedule to see our financial counselor.  Lab Results  Component Value Date   HGBA1C 6.6 (A) 08/10/2018    Elevated Blood pressure Blood pressure is elevated today. Will start low dose lisinopril as it was elevated at his last office visit as well.  Denies chest pain, shortness of breath, palpitations, lightheadedness, dizziness, headaches or BLE edema.  BP Readings from Last 3 Encounters:  11/12/18 Marland Kitchen)  143/86  08/10/18 (!) 145/87  07/14/18 119/74   Mixed Hyperlipidemia LDL at goal. He endorses medication compliance taking atorvastatin 34m daily. Denies any statin intolerance or myalgias.  Lab Results  Component Value Date   LDLCALC 52 08/10/2018   Review of Systems  Constitutional: Negative for fever, malaise/fatigue and weight loss.  HENT: Negative.  Negative for nosebleeds.   Eyes: Negative.  Negative for blurred vision, double vision and photophobia.  Respiratory: Negative.  Negative for cough and shortness of breath.    Cardiovascular: Negative.  Negative for chest pain, palpitations and leg swelling.  Gastrointestinal: Negative.  Negative for heartburn, nausea and vomiting.  Musculoskeletal: Negative.  Negative for myalgias.  Neurological: Negative.  Negative for dizziness, focal weakness, seizures and headaches.  Psychiatric/Behavioral: Negative.  Negative for suicidal ideas.    Past Medical History:  Diagnosis Date  . Diabetes mellitus without complication ([place]   . Hyperlipidemia     History reviewed. No pertinent surgical history.  Family History  Problem Relation Age of Onset  . Diabetes Mother   . Diabetes Father   . Diabetes Sister     Social History Reviewed with no changes to be made today.   Outpatient Medications Prior to Visit  Medication Sig Dispense Refill  . acetaminophen (TYLENOL) 500 MG tablet Take 2 tablets (1,000 mg total) by mouth every 6 (six) hours as needed. 30 tablet 0  . meloxicam (MOBIC) 7.5 MG tablet Take 1 tablet (7.5 mg total) by mouth daily. 30 tablet 0  . atorvastatin (LIPITOR) 10 MG tablet Take 1 tablet (10 mg total) by mouth daily. 90 tablet 1  . ibuprofen (ADVIL,MOTRIN) 600 MG tablet Take 1 tablet (600 mg total) by mouth every 6 (six) hours as needed. 30 tablet 0  . metFORMIN (GLUCOPHAGE) 500 MG tablet Take 1 tablet (500 mg total) by mouth 2 (two) times daily with a meal. 60 tablet 3   No facility-administered medications prior to visit.     No Known Allergies     Objective:    BP (!) 143/86   Pulse 86   Temp 98.5 F (36.9 C) (Oral)   Resp 16   Ht 6' (1.829 m)   Wt 204 lb 3.2 oz (92.6 kg)   SpO2 98%   BMI 27.69 kg/m  Wt Readings from Last 3 Encounters:  11/12/18 204 lb 3.2 oz (92.6 kg)  08/10/18 205 lb (93 kg)  07/14/18 185 lb 3 oz (84 kg)    Physical Exam Vitals signs and nursing note reviewed.  Constitutional:      Appearance: He is well-developed.  HENT:     Head: Normocephalic and atraumatic.  Neck:     Musculoskeletal: Normal  range of motion.  Cardiovascular:     Rate and Rhythm: Normal rate and regular rhythm.     Pulses:          Dorsalis pedis pulses are 1+ on the right side and 1+ on the left side.       Posterior tibial pulses are 2+ on the right side and 2+ on the left side.     Heart sounds: Normal heart sounds. No murmur. No friction rub. No gallop.   Pulmonary:     Effort: Pulmonary effort is normal. No tachypnea or respiratory distress.     Breath sounds: Normal breath sounds. No decreased breath sounds, wheezing, rhonchi or rales.  Chest:     Chest wall: No tenderness.  Abdominal:     General: Bowel sounds are  normal.     Palpations: Abdomen is soft.  Musculoskeletal: Normal range of motion.  Feet:     Right foot:     Protective Sensation: 10 sites tested. 10 sites sensed.     Skin integrity: Callus and dry skin present. No skin breakdown.     Toenail Condition: Right toenails are abnormally thick.     Left foot:     Protective Sensation: 10 sites tested. 10 sites sensed.     Skin integrity: Callus and dry skin present. No skin breakdown.     Toenail Condition: Left toenails are abnormally thick.  Skin:    General: Skin is warm and dry.  Neurological:     Mental Status: He is alert and oriented to person, place, and time.     Coordination: Coordination normal.  Psychiatric:        Behavior: Behavior normal. Behavior is cooperative.        Thought Content: Thought content normal.        Judgment: Judgment normal.        Patient has been counseled extensively about nutrition and exercise as well as the importance of adherence with medications and regular follow-up. The patient was given clear instructions to go to ER or return to medical center if symptoms don't improve, worsen or new problems develop. The patient verbalized understanding.   Follow-up: Return in about 3 months (around 02/11/2019) for DM, HTN.   Gildardo Pounds, FNP-BC [facility] [place], [place]   11/12/2018, 10:30 AM

## 2018-11-13 LAB — CMP14+EGFR
ALBUMIN: 4.2 g/dL (ref 3.8–4.9)
ALT: 18 IU/L (ref 0–44)
AST: 18 IU/L (ref 0–40)
Albumin/Globulin Ratio: 1.4 (ref 1.2–2.2)
Alkaline Phosphatase: 92 IU/L (ref 39–117)
BILIRUBIN TOTAL: 0.5 mg/dL (ref 0.0–1.2)
BUN / CREAT RATIO: 15 (ref 9–20)
BUN: 16 mg/dL (ref 6–24)
CALCIUM: 9.3 mg/dL (ref 8.7–10.2)
CHLORIDE: 98 mmol/L (ref 96–106)
CO2: 24 mmol/L (ref 20–29)
CREATININE: 1.1 mg/dL (ref 0.76–1.27)
GFR, EST AFRICAN AMERICAN: 85 mL/min/{1.73_m2} (ref >59)
GFR, EST NON AFRICAN AMERICAN: 74 mL/min/{1.73_m2} (ref >59)
GLUCOSE: 147 mg/dL — AB (ref 65–99)
Globulin, Total: 2.9 g/dL (ref 1.5–4.5)
Potassium: 4.2 mmol/L (ref 3.5–5.2)
Sodium: 139 mmol/L (ref 134–144)
TOTAL PROTEIN: 7.1 g/dL (ref 6.0–8.5)

## 2018-11-13 LAB — MICROALBUMIN / CREATININE URINE RATIO
Creatinine, Urine: 124.7 mg/dL
Microalb/Creat Ratio: 22 mg/g creat (ref 0–29)
Microalbumin, Urine: 27.1 ug/mL

## 2018-12-31 ENCOUNTER — Ambulatory Visit: Payer: Self-pay | Attending: Family Medicine

## 2019-02-01 MED FILL — ?ATORVASTATIN 10 MG TABLET: 10 | 90 days supply | Qty: 90 | Fill #1

## 2019-02-01 MED FILL — GLIMEPIRIDE 2 MG TABS: 2 | 90 days supply | Qty: 90 | Fill #1

## 2019-02-01 MED FILL — LISINOPRIL 5 MG TAB: 5 | 90 days supply | Qty: 90 | Fill #1

## 2019-03-11 MED FILL — metFORMIN HCL 500 MG TABS: 500 | 30 days supply | Qty: 60 | Fill #1

## 2019-05-15 ENCOUNTER — Other Ambulatory Visit: Payer: Self-pay | Admitting: Nurse Practitioner

## 2019-05-15 DIAGNOSIS — E785 Hyperlipidemia, unspecified: Secondary | ICD-10-CM

## 2019-05-15 DIAGNOSIS — E119 Type 2 diabetes mellitus without complications: Secondary | ICD-10-CM

## 2019-05-15 MED FILL — LISINOPRIL 5 MG TAB: 5 | 90 days supply | Qty: 90 | Fill #2

## 2019-05-15 MED FILL — ?ATORVASTATIN 10 MG TABLET: 10 | 90 days supply | Qty: 90 | Fill #1

## 2019-05-15 MED FILL — GLIMEPIRIDE 2 MG TABS: 2 | 30 days supply | Qty: 60 | Fill #0

## 2019-06-24 ENCOUNTER — Other Ambulatory Visit: Payer: Self-pay | Admitting: Nurse Practitioner

## 2019-06-24 DIAGNOSIS — E119 Type 2 diabetes mellitus without complications: Secondary | ICD-10-CM

## 2019-06-24 MED FILL — ?METFORMIN HCL 500MG TABLET: 500 | 30 days supply | Qty: 60 | Fill #1

## 2019-06-26 ENCOUNTER — Other Ambulatory Visit: Payer: Self-pay | Admitting: Nurse Practitioner

## 2019-06-26 DIAGNOSIS — E119 Type 2 diabetes mellitus without complications: Secondary | ICD-10-CM

## 2019-07-19 ENCOUNTER — Other Ambulatory Visit: Payer: Self-pay | Admitting: Nurse Practitioner

## 2019-07-19 DIAGNOSIS — E119 Type 2 diabetes mellitus without complications: Secondary | ICD-10-CM

## 2019-07-19 MED FILL — ?METFORMIN HCL 500MG TABLET: 500 | 30 days supply | Qty: 60 | Fill #1

## 2019-08-22 ENCOUNTER — Other Ambulatory Visit: Payer: Self-pay | Admitting: Nurse Practitioner

## 2019-08-22 DIAGNOSIS — E119 Type 2 diabetes mellitus without complications: Secondary | ICD-10-CM

## 2019-08-22 MED FILL — LISINOPRIL 5 MG TABLET: 5 | 90 days supply | Qty: 90 | Fill #3

## 2019-08-22 MED FILL — ATORVASTATIN 10 MG TABLET: 10 | 30 days supply | Qty: 30 | Fill #0

## 2019-09-30 ENCOUNTER — Other Ambulatory Visit: Payer: Self-pay

## 2019-09-30 ENCOUNTER — Encounter: Payer: Self-pay | Admitting: Nurse Practitioner

## 2019-09-30 ENCOUNTER — Ambulatory Visit: Payer: BLUE CROSS/BLUE SHIELD | Attending: Nurse Practitioner | Admitting: Nurse Practitioner

## 2019-09-30 VITALS — BP 126/73 | HR 75 | Temp 98.0°F | Ht 73.0 in | Wt 208.8 lb

## 2019-09-30 DIAGNOSIS — I10 Essential (primary) hypertension: Secondary | ICD-10-CM

## 2019-09-30 DIAGNOSIS — E119 Type 2 diabetes mellitus without complications: Secondary | ICD-10-CM | POA: Diagnosis not present

## 2019-09-30 DIAGNOSIS — E785 Hyperlipidemia, unspecified: Secondary | ICD-10-CM | POA: Diagnosis not present

## 2019-09-30 DIAGNOSIS — Z1211 Encounter for screening for malignant neoplasm of colon: Secondary | ICD-10-CM

## 2019-09-30 LAB — POCT GLYCOSYLATED HEMOGLOBIN (HGB A1C): Hemoglobin A1C: 7.1 % — AB (ref 4.0–5.6)

## 2019-09-30 LAB — GLUCOSE, POCT (MANUAL RESULT ENTRY): POC Glucose: 136 mg/dl — AB (ref 70–99)

## 2019-09-30 MED ORDER — ATORVASTATIN CALCIUM 10 MG PO TABS: 10.0000 mg | ORAL_TABLET | Freq: Every day | ORAL | 1 refills | Status: DC

## 2019-09-30 MED ORDER — GLIMEPIRIDE 2 MG PO TABS: 2.0000 mg | ORAL_TABLET | Freq: Every day | ORAL | 0 refills | Status: DC

## 2019-09-30 MED ORDER — METFORMIN HCL 500 MG PO TABS: 500.0000 mg | ORAL_TABLET | Freq: Two times a day (BID) | ORAL | 1 refills | Status: DC

## 2019-09-30 MED ORDER — LISINOPRIL 5 MG PO TABS: 5.0000 mg | ORAL_TABLET | Freq: Every day | ORAL | 3 refills | Status: DC

## 2019-09-30 MED FILL — ATORVASTATIN 10 MG TABLET: 10 | 30 days supply | Qty: 30 | Fill #0

## 2019-09-30 MED FILL — metFORMIN HCL 500 MG TABS: 500 | 30 days supply | Qty: 60 | Fill #0

## 2019-09-30 MED FILL — GLIMEPIRIDE 2 MG TABS: 2 | 30 days supply | Qty: 30 | Fill #0

## 2019-09-30 NOTE — Progress Notes (Signed)
Assessment & Plan:  Carl Hendricks was seen today for medication refill.  Diagnoses and all orders for this visit:  Type 2 diabetes mellitus without complication, without long-term current use of insulin (HCC) -     Glucose (CBG) -     HgB A1c -     metFORMIN (GLUCOPHAGE) 500 MG tablet; Take 1 tablet (500 mg total) by mouth 2 (two) times daily with a meal. -     glimepiride (AMARYL) 2 MG tablet; Take 1 tablet (2 mg total) by mouth daily before breakfast. Patient needs payment plan with meds today. Please fill as a 90 day supply -     CBC -     CMP14+EGFR -     Lipid Panel -     Fecal occult blood, imunochemical([facility]) Continue blood sugar control as discussed in office today, low carbohydrate diet, and regular physical exercise as tolerated, 150 minutes per week (30 min each day, 5 days per week, or 50 min 3 days per week). Keep blood sugar logs with fasting goal of 90-130 mg/dl, post prandial (after you eat) less than 180.  For Hypoglycemia: BS <60 and Hyperglycemia BS >400; contact the clinic ASAP. Annual eye exams and foot exams are recommended.   Hyperlipidemia, unspecified hyperlipidemia type -     atorvastatin (LIPITOR) 10 MG tablet; Take 1 tablet (10 mg total) by mouth daily. Patient needs payment plan with meds today. Please fill as a 90 day supply INSTRUCTIONS: Work on a low fat, heart healthy diet and participate in regular aerobic exercise program by working out at least 150 minutes per week; 5 days a week-30 minutes per day. Avoid red meat/beef/steak,  fried foods. junk foods, sodas, sugary drinks, unhealthy snacking, alcohol and smoking.  Drink at least 80 oz of water per day and monitor your carbohydrate intake daily.     Essential Hypertension Continue lisinopril 5 mg daily as prescribed.  Remember to bring in your blood pressure log with you for your follow up appointment.  DASH/Mediterranean Diets are healthier choices for HTN.    Colon cancer screening -      Fecal occult blood, imunochemical([facility])    Patient has been counseled on age-appropriate routine health concerns for screening and prevention. These are reviewed and up-to-date. Referrals have been placed accordingly. Immunizations are up-to-date or declined.    Subjective:   Chief Complaint  Patient presents with  . Medication Refill    Pt. is here for medication refill for diabetes.    HPI Carl Hendricks 58 y.o. male presents to office today for follow up.  has a past medical history of Diabetes mellitus without complication ([place]) and Hyperlipidemia.   Essential Hypertension Blood pressure well controlled. He endorses medication compliance taking low-dose lisinopril 5 mg daily. He does not monitor his blood pressure at home. Weight is stable. Denies chest pain, shortness of breath, palpitations, lightheadedness, dizziness, headaches or BLE edema. He does not exercise however stay active.  BP Readings from Last 3 Encounters:  09/30/19 126/73  11/12/18 (!) 143/86  08/10/18 (!) 145/87    DM TYPE 2 A1C Slightly increased from 6.6 to 7.1. Current medications include glimepiride 2 mg daily and metformin 500 mg twice daily.  He does not consistently monitor his blood glucose levels at home.  Not strictly adherent to a diabetic diet.  LDL is near goal at less than 70 and he endorses medication compliance taking atorvastatin 10 mg daily.  Denies any statin intolerance or myalgias.  Also taking low-dose  ACE. Lab Results  Component Value Date   HGBA1C 7.1 (A) 09/30/2019   Lab Results  Component Value Date   LDLCALC 71 09/30/2019    Review of Systems  Constitutional: Negative for fever, malaise/fatigue and weight loss.  HENT: Negative.  Negative for nosebleeds.   Eyes: Negative.  Negative for blurred vision, double vision and photophobia.  Respiratory: Negative.  Negative for cough and shortness of breath.   Cardiovascular: Negative.  Negative for chest pain, palpitations  and leg swelling.  Gastrointestinal: Negative.  Negative for heartburn, nausea and vomiting.  Musculoskeletal: Negative.  Negative for myalgias.  Neurological: Negative.  Negative for dizziness, focal weakness, seizures and headaches.  Psychiatric/Behavioral: Negative.  Negative for suicidal ideas.    Past Medical History:  Diagnosis Date  . Diabetes mellitus without complication ([place])   . Hyperlipidemia     History reviewed. No pertinent surgical history.  Family History  Problem Relation Age of Onset  . Diabetes Mother   . Diabetes Father   . Diabetes Sister     Social History Reviewed with no changes to be made today.   Outpatient Medications Prior to Visit  Medication Sig Dispense Refill  . acetaminophen (TYLENOL) 500 MG tablet Take 2 tablets (1,000 mg total) by mouth every 6 (six) hours as needed. 30 tablet 0  . atorvastatin (LIPITOR) 10 MG tablet Take 1 tablet (10 mg total) by mouth daily. MUST MAKE APPT FOR FURTHER REFILLS 30 tablet 0  . glimepiride (AMARYL) 2 MG tablet Take 1 tablet (2 mg total) by mouth daily before breakfast. MUST MAKE APPT FOR FURTHER REFILLS 60 tablet 0  . lisinopril (PRINIVIL,ZESTRIL) 5 MG tablet Take 1 tablet (5 mg total) by mouth daily. 90 tablet 3  . meloxicam (MOBIC) 7.5 MG tablet Take 1 tablet (7.5 mg total) by mouth daily. (Patient not taking: Reported on 09/30/2019) 30 tablet 0  . metFORMIN (GLUCOPHAGE) 500 MG tablet Take 1 tablet (500 mg total) by mouth daily with breakfast. 180 tablet 1   No facility-administered medications prior to visit.    No Known Allergies     Objective:    BP 126/73 (BP Location: Left Arm, Patient Position: Sitting, Cuff Size: Normal)   Pulse 75   Temp 98 F (36.7 C) (Oral)   Ht 6' 1"  (1.854 m)   Wt 208 lb 12.8 oz (94.7 kg)   SpO2 95%   BMI 27.55 kg/m  Wt Readings from Last 3 Encounters:  09/30/19 208 lb 12.8 oz (94.7 kg)  11/12/18 204 lb 3.2 oz (92.6 kg)  08/10/18 205 lb (93 kg)    Physical Exam  Vitals and nursing note reviewed.  Constitutional:      Appearance: He is well-developed.  HENT:     Head: Normocephalic and atraumatic.  Cardiovascular:     Rate and Rhythm: Normal rate and regular rhythm.     Heart sounds: Normal heart sounds. No murmur. No friction rub. No gallop.   Pulmonary:     Effort: Pulmonary effort is normal. No tachypnea or respiratory distress.     Breath sounds: Normal breath sounds. No decreased breath sounds, wheezing, rhonchi or rales.  Chest:     Chest wall: No tenderness.  Abdominal:     General: Bowel sounds are normal.     Palpations: Abdomen is soft.  Musculoskeletal:        General: Normal range of motion.     Cervical back: Normal range of motion.  Skin:    General: Skin is  warm and dry.  Neurological:     Mental Status: He is alert and oriented to person, place, and time.     Coordination: Coordination normal.  Psychiatric:        Behavior: Behavior normal. Behavior is cooperative.        Thought Content: Thought content normal.        Judgment: Judgment normal.          Patient has been counseled extensively about nutrition and exercise as well as the importance of adherence with medications and regular follow-up. The patient was given clear instructions to go to ER or return to medical center if symptoms don't improve, worsen or new problems develop. The patient verbalized understanding.   Follow-up: Return in about 3 months (around 12/29/2019).   Gildardo Pounds, FNP-BC [facility] [place], [place]   10/03/2019, 9:21 PM

## 2019-10-01 LAB — CBC
Hematocrit: 45.1 % (ref 37.5–51.0)
Hemoglobin: 15.9 g/dL (ref 13.0–17.7)
MCH: 30.4 pg (ref 26.6–33.0)
MCHC: 35.3 g/dL (ref 31.5–35.7)
MCV: 86 fL (ref 79–97)
Platelets: 204 10*3/uL (ref 150–450)
RBC: 5.23 x10E6/uL (ref 4.14–5.80)
RDW: 12.8 % (ref 11.6–15.4)
WBC: 9.7 10*3/uL (ref 3.4–10.8)

## 2019-10-01 LAB — CMP14+EGFR
ALT: 20 IU/L (ref 0–44)
AST: 19 IU/L (ref 0–40)
Albumin/Globulin Ratio: 1.6 (ref 1.2–2.2)
Albumin: 4.7 g/dL (ref 3.8–4.9)
Alkaline Phosphatase: 91 IU/L (ref 39–117)
BUN/Creatinine Ratio: 14 (ref 9–20)
BUN: 16 mg/dL (ref 6–24)
Bilirubin Total: 0.5 mg/dL (ref 0.0–1.2)
CO2: 23 mmol/L (ref 20–29)
Calcium: 9.5 mg/dL (ref 8.7–10.2)
Chloride: 101 mmol/L (ref 96–106)
Creatinine, Ser: 1.14 mg/dL (ref 0.76–1.27)
GFR calc Af Amer: 81 mL/min/{1.73_m2} (ref >59)
GFR calc non Af Amer: 70 mL/min/{1.73_m2} (ref >59)
Globulin, Total: 2.9 g/dL (ref 1.5–4.5)
Glucose: 99 mg/dL (ref 65–99)
Potassium: 4.1 mmol/L (ref 3.5–5.2)
Sodium: 140 mmol/L (ref 134–144)
Total Protein: 7.6 g/dL (ref 6.0–8.5)

## 2019-10-01 LAB — LIPID PANEL
Chol/HDL Ratio: 4 ratio (ref 0.0–5.0)
Cholesterol, Total: 139 mg/dL (ref 100–199)
HDL: 35 mg/dL — ABNORMAL LOW (ref >39)
LDL Chol Calc (NIH): 71 mg/dL (ref 0–99)
Triglycerides: 200 mg/dL — ABNORMAL HIGH (ref 0–149)
VLDL Cholesterol Cal: 33 mg/dL (ref 5–40)

## 2019-10-04 ENCOUNTER — Encounter: Payer: Self-pay | Admitting: Nurse Practitioner

## 2019-11-22 MED FILL — GLIMEPIRIDE 2 MG TABS: 2 | 30 days supply | Qty: 30 | Fill #1

## 2019-11-22 MED FILL — ATORVASTATIN 10 MG TABLET: 10 | 30 days supply | Qty: 30 | Fill #1

## 2019-11-22 MED FILL — metFORMIN HCL 500 MG TABS: 500 | 30 days supply | Qty: 60 | Fill #1

## 2019-12-30 ENCOUNTER — Encounter: Payer: Self-pay | Admitting: Nurse Practitioner

## 2019-12-30 ENCOUNTER — Other Ambulatory Visit: Payer: Self-pay

## 2019-12-30 ENCOUNTER — Ambulatory Visit: Payer: BLUE CROSS/BLUE SHIELD | Attending: Nurse Practitioner | Admitting: Nurse Practitioner

## 2019-12-30 DIAGNOSIS — E119 Type 2 diabetes mellitus without complications: Secondary | ICD-10-CM | POA: Diagnosis not present

## 2019-12-30 DIAGNOSIS — M25562 Pain in left knee: Secondary | ICD-10-CM

## 2019-12-30 DIAGNOSIS — I10 Essential (primary) hypertension: Secondary | ICD-10-CM

## 2019-12-30 DIAGNOSIS — E785 Hyperlipidemia, unspecified: Secondary | ICD-10-CM | POA: Diagnosis not present

## 2019-12-30 DIAGNOSIS — G8929 Other chronic pain: Secondary | ICD-10-CM

## 2019-12-30 DIAGNOSIS — Z7984 Long term (current) use of oral hypoglycemic drugs: Secondary | ICD-10-CM

## 2019-12-30 MED ORDER — METFORMIN HCL 500 MG PO TABS: 500.0000 mg | ORAL_TABLET | Freq: Two times a day (BID) | ORAL | 1 refills | Status: DC

## 2019-12-30 MED ORDER — ATORVASTATIN CALCIUM 10 MG PO TABS: 10.0000 mg | ORAL_TABLET | Freq: Every day | ORAL | 1 refills | Status: DC

## 2019-12-30 MED ORDER — GLIMEPIRIDE 2 MG PO TABS: 2.0000 mg | ORAL_TABLET | Freq: Every day | ORAL | 0 refills | Status: DC

## 2019-12-30 MED ORDER — MELOXICAM 7.5 MG PO TABS: 7.5000 mg | ORAL_TABLET | Freq: Every day | ORAL | 0 refills | Status: DC

## 2019-12-30 MED ORDER — MISC. DEVICES MISC: 0 refills | Status: AC

## 2019-12-30 MED ORDER — LISINOPRIL 5 MG PO TABS: 5.0000 mg | ORAL_TABLET | Freq: Every day | ORAL | 3 refills | Status: DC

## 2019-12-30 NOTE — Progress Notes (Signed)
Virtual Visit via Telephone Note Due to national recommendations of social distancing due to [place] 19, telehealth visit is felt to be most appropriate for this patient at this time.  I discussed the limitations, risks, security and privacy concerns of performing an evaluation and management service by telephone and the availability of in person appointments. I also discussed with the patient that there may be a patient responsible charge related to this service. The patient expressed understanding and agreed to proceed.    I connected with Carl Hendricks on 12/30/19  at   4:10 PM EST  EDT by telephone and verified that I am speaking with the correct person using two identifiers.   Consent I discussed the limitations, risks, security and privacy concerns of performing an evaluation and management service by telephone and the availability of in person appointments. I also discussed with the patient that there may be a patient responsible charge related to this service. The patient expressed understanding and agreed to proceed.   Location of Patient: Private Residence   Location of Provider: [place] [facility] [employer] Office    Persons participating in Telemedicine visit: Geryl Rankins FNP-BC [place]  Arabic Interpreter   History of Present Illness: Telemedicine visit for: DM TYPE 2/HTN/HPL   DM TYPE 2 Not well controlled or at goal. Does not have a meter to monitor his blood glucose levels. I increased his amaryl to 4 mg today. He is also taking metformin 500 mg BID. Denies any symptoms of hypo or hyperglycemia. Taking ACE and STATIN. LDL at goal of <70. Blood pressure is well controlled on lisinopril 5 mg daily.  Lab Results  Component Value Date   HGBA1C 7.1 (H) 01/06/2020   Lab Results  Component Value Date   HGBA1C 7.1 (A) 09/30/2019   Lab Results  Component Value Date   LDLCALC 64 01/06/2020   BP Readings from Last 3 Encounters:  09/30/19 126/73   11/12/18 (!) 143/86  08/10/18 (!) 145/87     Past Medical History:  Diagnosis Date  . Diabetes mellitus without complication ([place])   . Hyperlipidemia     History reviewed. No pertinent surgical history.  Family History  Problem Relation Age of Onset  . Diabetes Mother   . Diabetes Father   . Diabetes Sister     Social History   Socioeconomic History  . Marital status: Married    Spouse name: Not on file  . Number of children: Not on file  . Years of education: Not on file  . Highest education level: Not on file  Occupational History  . Not on file  Tobacco Use  . Smoking status: Never Smoker  . Smokeless tobacco: Never Used  Substance and Sexual Activity  . Alcohol use: No  . Drug use: No  . Sexual activity: Yes  Other Topics Concern  . Not on file  Social History Narrative  . Not on file   Social Determinants of Health   Financial Resource Strain:   . Difficulty of Paying Living Expenses:   Food Insecurity:   . Worried About Charity fundraiser in the Last Year:   . Arboriculturist in the Last Year:   Transportation Needs:   . Film/video editor (Medical):   Marland Kitchen Lack of Transportation (Non-Medical):   Physical Activity:   . Days of Exercise per Week:   . Minutes of Exercise per Session:   Stress:   . Feeling of Stress :  Social Connections:   . Frequency of Communication with Friends and Family:   . Frequency of Social Gatherings with Friends and Family:   . Attends Religious Services:   . Active Member of Clubs or Organizations:   . Attends Banker Meetings:   Marland Kitchen Marital Status:      Observations/Objective: Awake, alert and oriented x 3   Review of Systems  Constitutional: Negative for fever, malaise/fatigue and weight loss.  HENT: Negative.  Negative for nosebleeds.   Eyes: Negative.  Negative for blurred vision, double vision and photophobia.  Respiratory: Negative.  Negative for cough and shortness of breath.    Cardiovascular: Negative.  Negative for chest pain, palpitations and leg swelling.  Gastrointestinal: Negative.  Negative for heartburn, nausea and vomiting.  Musculoskeletal: Positive for joint pain. Negative for myalgias.  Neurological: Negative.  Negative for dizziness, focal weakness, seizures and headaches.  Psychiatric/Behavioral: Negative.  Negative for suicidal ideas.    Assessment and Plan: Rontae was seen today for follow-up.  Diagnoses and all orders for this visit:  Type 2 diabetes mellitus without complication, without long-term current use of insulin (HCC) -     Misc. Devices MISC; Please provide patient with insurance approved glucometer, strips and lancets. E11.65. Instructions: monitor blood glucose levels twice daily. Strips quantity 100 with 6 refills. Lancets quantity 100 with 6 refills. -     glimepiride (AMARYL) 2 MG tablet; Take 1 tablet (2 mg total) by mouth daily before breakfast. Please fill as a 90 day supply -     metFORMIN (GLUCOPHAGE) 500 MG tablet; Take 1 tablet (500 mg total) by mouth 2 (two) times daily with a meal. Please fill as a 90 day supply Continue blood sugar control as discussed in office today, low carbohydrate diet, and regular physical exercise as tolerated, 150 minutes per week (30 min each day, 5 days per week, or 50 min 3 days per week). Keep blood sugar logs with fasting goal of 90-130 mg/dl, post prandial (after you eat) less than 180.  For Hypoglycemia: BS <60 and Hyperglycemia BS >400; contact the clinic ASAP. Annual eye exams and foot exams are recommended.   Essential hypertension -     lisinopril (ZESTRIL) 5 MG tablet; Take 1 tablet (5 mg total) by mouth daily. Please fill as a 90 day supply Continue all antihypertensives as prescribed.  Remember to bring in your blood pressure log with you for your follow up appointment.  DASH/Mediterranean Diets are healthier choices for HTN.    Hyperlipidemia, unspecified hyperlipidemia type -      atorvastatin (LIPITOR) 10 MG tablet; Take 1 tablet (10 mg total) by mouth daily. Please fill as a 90 day supply INSTRUCTIONS: Work on a low fat, heart healthy diet and participate in regular aerobic exercise program by working out at least 150 minutes per week; 5 days a week-30 minutes per day. Avoid red meat/beef/steak,  fried foods. junk foods, sodas, sugary drinks, unhealthy snacking, alcohol and smoking.  Drink at least 80 oz of water per day and monitor your carbohydrate intake daily.    Chronic pain of left knee -     meloxicam (MOBIC) 7.5 MG tablet; Take 1 tablet (7.5 mg total) by mouth daily. Work on losing weight to help reduce joint pain. May alternate with heat and ice application for pain relief. May also alternate with acetaminophen  as prescribed pain relief. Other alternatives include massage, acupuncture and water aerobics.  You must stay active and avoid a sedentary lifestyle.  Follow Up Instructions Return in about 3 months (around 03/31/2020).     I discussed the assessment and treatment plan with the patient. The patient was provided an opportunity to ask questions and all were answered. The patient agreed with the plan and demonstrated an understanding of the instructions.   The patient was advised to call back or seek an in-person evaluation if the symptoms worsen or if the condition fails to improve as anticipated.  I provided 15 minutes of non-face-to-face time during this encounter including median intraservice time, reviewing previous notes, labs, imaging, medications and explaining diagnosis and management.  Claiborne Rigg, FNP-BC

## 2019-12-31 MED FILL — metFORMIN HCL 500 MG TABS: 500 | 90 days supply | Qty: 180 | Fill #0

## 2019-12-31 MED FILL — TRUE METRIX GLUCOSE TEST ST: 50 days supply | Qty: 100 | Fill #0

## 2019-12-31 MED FILL — !TRUE METRIX BLOOD GLUCOSE: 30 days supply | Qty: 1 | Fill #0

## 2019-12-31 MED FILL — TRUEplus LANCETS 28G MISC: 50 days supply | Qty: 100 | Fill #0

## 2019-12-31 MED FILL — ATORVASTATIN 10 MG TABLET: 10 | 90 days supply | Qty: 90 | Fill #0

## 2019-12-31 MED FILL — GLIMEPIRIDE 2 MG TABS: 2 | 30 days supply | Qty: 30 | Fill #0

## 2019-12-31 MED FILL — LISINOPRIL 5 MG TABLET: 5 | 90 days supply | Qty: 90 | Fill #0

## 2019-12-31 MED FILL — MELOXICAM 7.5 MG TABLET: 7.5 | 30 days supply | Qty: 30 | Fill #0

## 2020-01-06 ENCOUNTER — Ambulatory Visit: Payer: BLUE CROSS/BLUE SHIELD | Attending: Nurse Practitioner

## 2020-01-06 ENCOUNTER — Other Ambulatory Visit: Payer: Self-pay

## 2020-01-06 DIAGNOSIS — E785 Hyperlipidemia, unspecified: Secondary | ICD-10-CM

## 2020-01-06 DIAGNOSIS — Z0000 Encounter for general adult medical examination without abnormal findings: Secondary | ICD-10-CM

## 2020-01-06 DIAGNOSIS — E119 Type 2 diabetes mellitus without complications: Secondary | ICD-10-CM

## 2020-01-06 DIAGNOSIS — I10 Essential (primary) hypertension: Secondary | ICD-10-CM

## 2020-01-07 LAB — CMP14+EGFR
ALT: 21 IU/L (ref 0–44)
AST: 22 IU/L (ref 0–40)
Albumin/Globulin Ratio: 1.6 (ref 1.2–2.2)
Albumin: 4.5 g/dL (ref 3.8–4.9)
Alkaline Phosphatase: 88 IU/L (ref 39–117)
BUN/Creatinine Ratio: 17 (ref 9–20)
BUN: 18 mg/dL (ref 6–24)
Bilirubin Total: 0.4 mg/dL (ref 0.0–1.2)
CO2: 27 mmol/L (ref 20–29)
Calcium: 9.4 mg/dL (ref 8.7–10.2)
Chloride: 100 mmol/L (ref 96–106)
Creatinine, Ser: 1.06 mg/dL (ref 0.76–1.27)
GFR calc Af Amer: 88 mL/min/{1.73_m2} (ref >59)
GFR calc non Af Amer: 76 mL/min/{1.73_m2} (ref >59)
Globulin, Total: 2.9 g/dL (ref 1.5–4.5)
Glucose: 147 mg/dL — ABNORMAL HIGH (ref 65–99)
Potassium: 4.5 mmol/L (ref 3.5–5.2)
Sodium: 140 mmol/L (ref 134–144)
Total Protein: 7.4 g/dL (ref 6.0–8.5)

## 2020-01-07 LAB — CBC
Hematocrit: 44.9 % (ref 37.5–51.0)
Hemoglobin: 16 g/dL (ref 13.0–17.7)
MCH: 31.3 pg (ref 26.6–33.0)
MCHC: 35.6 g/dL (ref 31.5–35.7)
MCV: 88 fL (ref 79–97)
Platelets: 186 10*3/uL (ref 150–450)
RBC: 5.12 x10E6/uL (ref 4.14–5.80)
RDW: 12.4 % (ref 11.6–15.4)
WBC: 9.2 10*3/uL (ref 3.4–10.8)

## 2020-01-07 LAB — LIPID PANEL
Chol/HDL Ratio: 4.2 ratio (ref 0.0–5.0)
Cholesterol, Total: 129 mg/dL (ref 100–199)
HDL: 31 mg/dL — ABNORMAL LOW (ref >39)
LDL Chol Calc (NIH): 64 mg/dL (ref 0–99)
Triglycerides: 204 mg/dL — ABNORMAL HIGH (ref 0–149)
VLDL Cholesterol Cal: 34 mg/dL (ref 5–40)

## 2020-01-07 LAB — HEMOGLOBIN A1C
Est. average glucose Bld gHb Est-mCnc: 157 mg/dL
Hgb A1c MFr Bld: 7.1 % — ABNORMAL HIGH (ref 4.8–5.6)

## 2020-01-07 LAB — HIV ANTIBODY (ROUTINE TESTING W REFLEX): HIV Screen 4th Generation wRfx: NONREACTIVE

## 2020-01-19 ENCOUNTER — Encounter: Payer: Self-pay | Admitting: Nurse Practitioner

## 2020-03-20 MED FILL — GLIMEPIRIDE 2 MG TABS: 2 | 30 days supply | Qty: 30 | Fill #1

## 2020-03-27 MED FILL — ATORVASTATIN 10 MG TABLET: 10 | 90 days supply | Qty: 90 | Fill #1

## 2020-04-20 ENCOUNTER — Other Ambulatory Visit: Payer: Self-pay | Admitting: Nurse Practitioner

## 2020-04-20 DIAGNOSIS — E119 Type 2 diabetes mellitus without complications: Secondary | ICD-10-CM

## 2020-04-20 MED FILL — ATORVASTATIN 10 MG TABLET: 10 | 90 days supply | Qty: 90 | Fill #1

## 2020-04-20 MED FILL — LISINOPRIL 5 MG TABLET: 5 | 90 days supply | Qty: 90 | Fill #1

## 2020-04-21 MED FILL — GLIMEPIRIDE 2 MG TABS: 2 | 30 days supply | Qty: 30 | Fill #0

## 2020-06-12 MED FILL — GLIMEPIRIDE 2 MG TABS: 2 | 30 days supply | Qty: 30 | Fill #1

## 2020-06-12 MED FILL — METFORMIN HCL 500 MG TABS: 500 | 90 days supply | Qty: 180 | Fill #1

## 2020-07-09 IMAGING — CR DG CHEST 2V
2 series · 2 of 2 positions shown · non-contrast
Comparison: None.

CLINICAL DATA: Cough and leukocytosis.

EXAM:
CHEST - 2 VIEW

[chest pa]
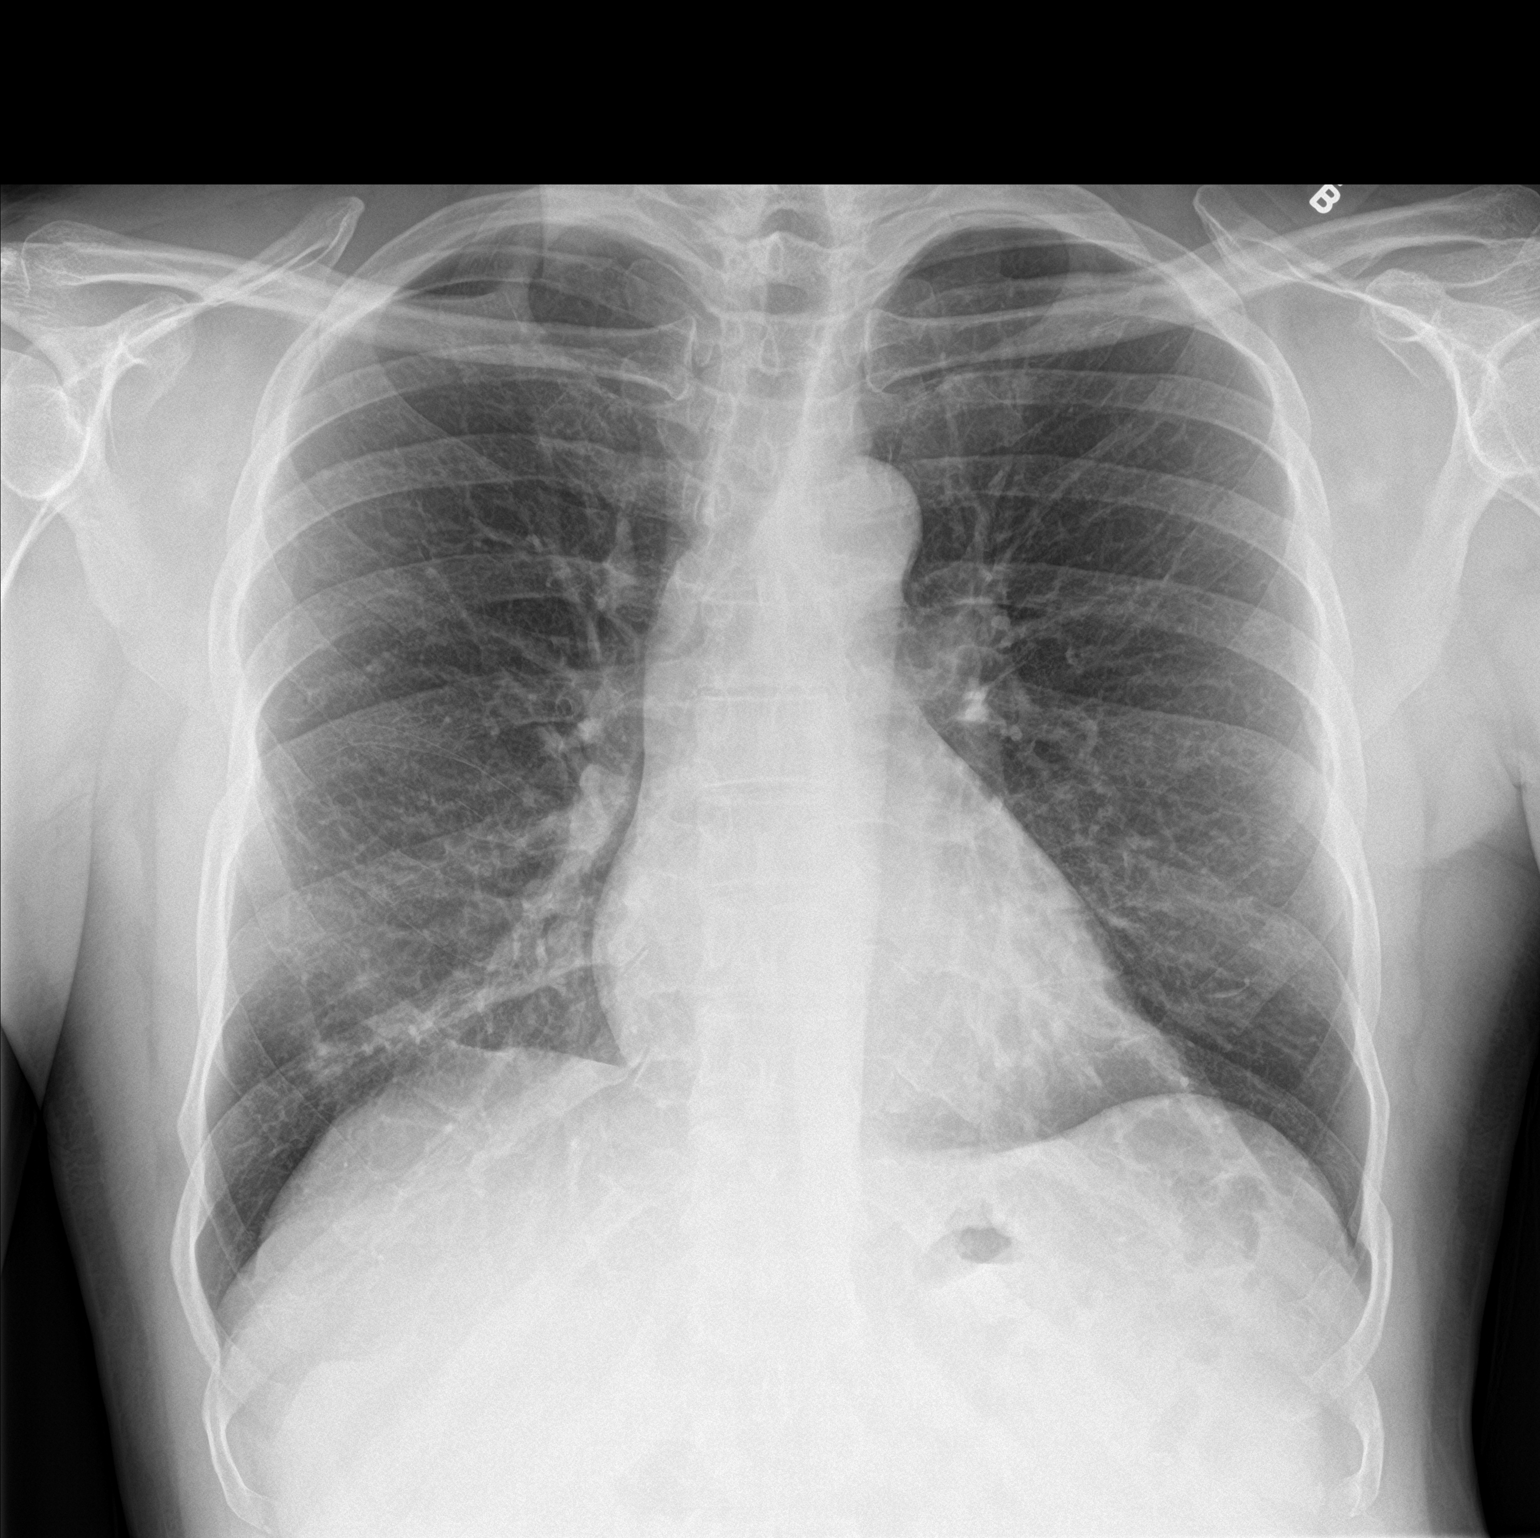

[chest lat]
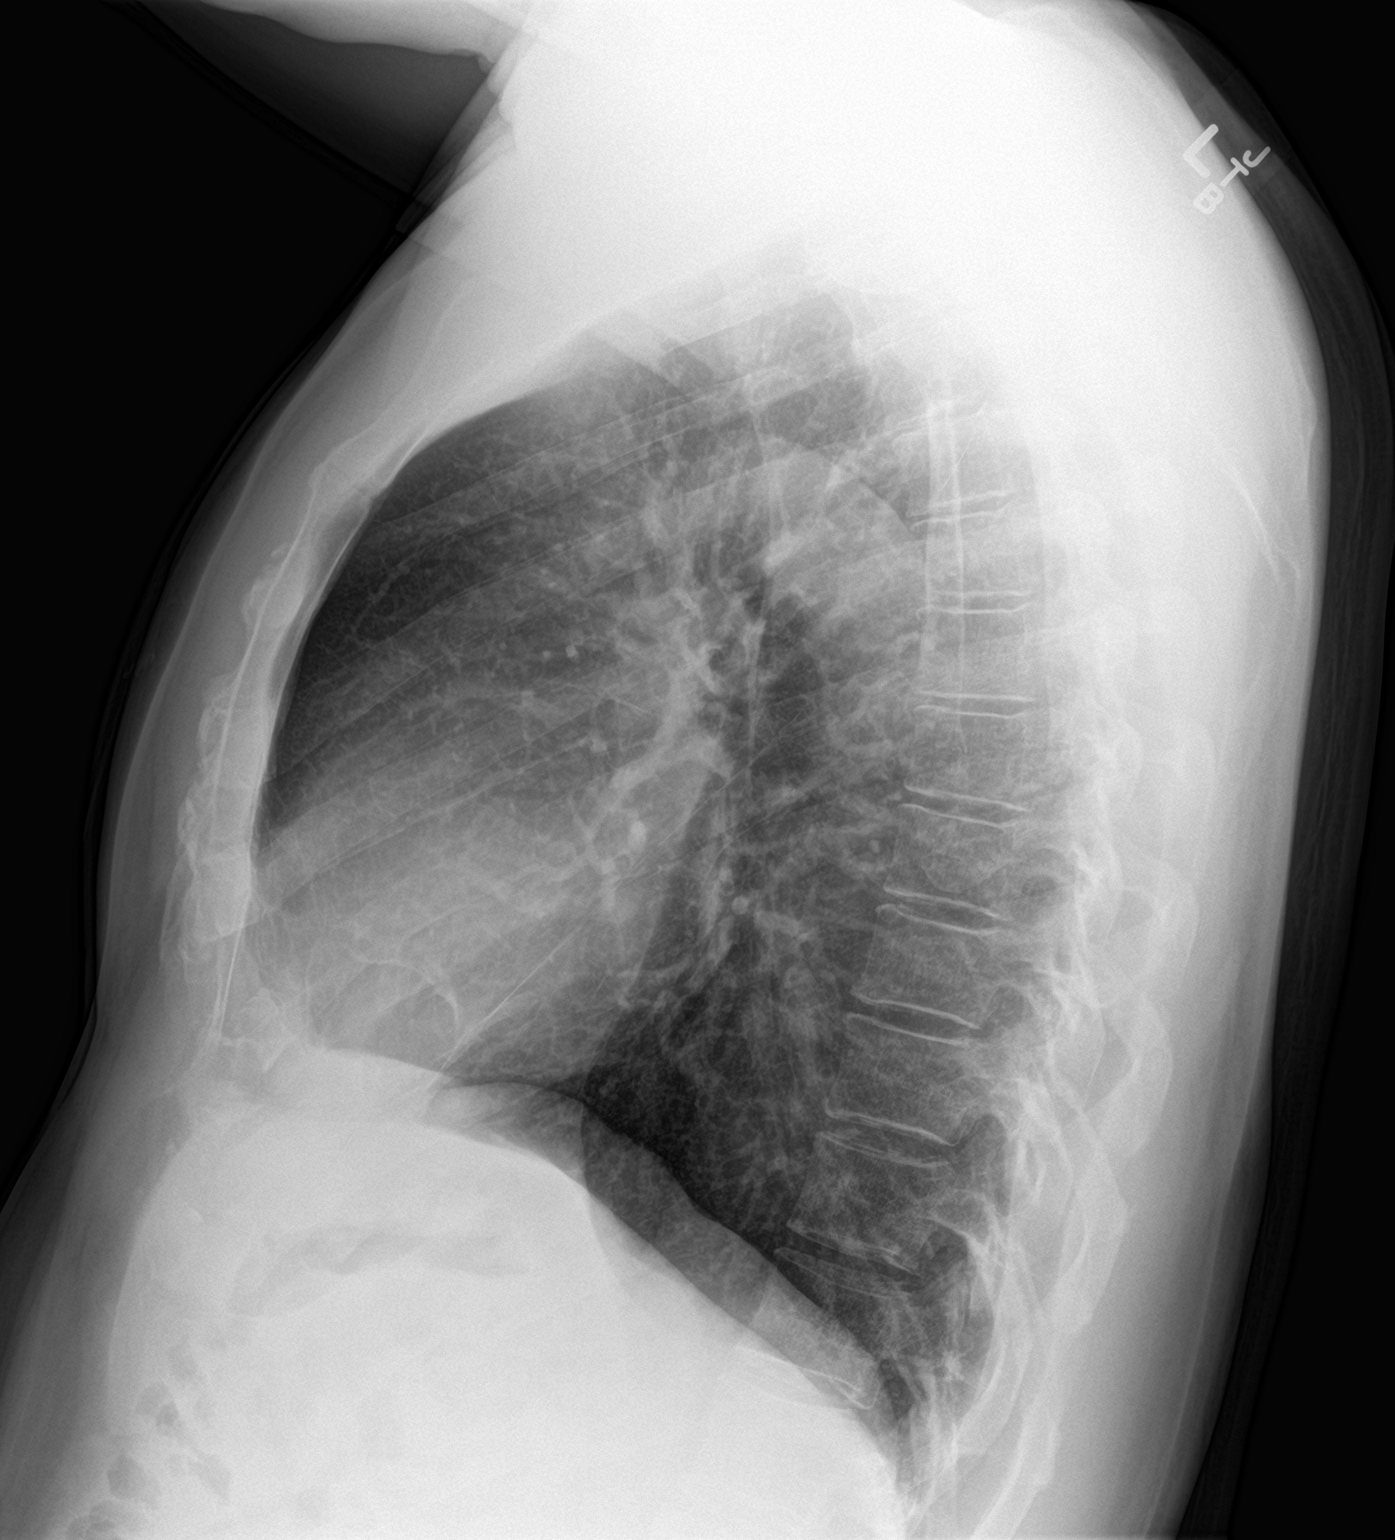

[2 of 2 positions shown; findings below may reference images not displayed]

FINDINGS: The heart size and mediastinal contours are within normal limits.
Both lungs are clear. The visualized skeletal structures are
unremarkable.
IMPRESSION: No active cardiopulmonary disease. No evidence of pneumonia or
pulmonary edema.

## 2020-07-09 IMAGING — CR DG KNEE COMPLETE 4+V*L*
4 series · 4 of 4 positions shown · non-contrast
Comparison: None.

CLINICAL DATA: Chronic LEFT knee pain, mostly anterior knee pain.

EXAM:
LEFT KNEE - COMPLETE 4+ VIEW

[knee ap]
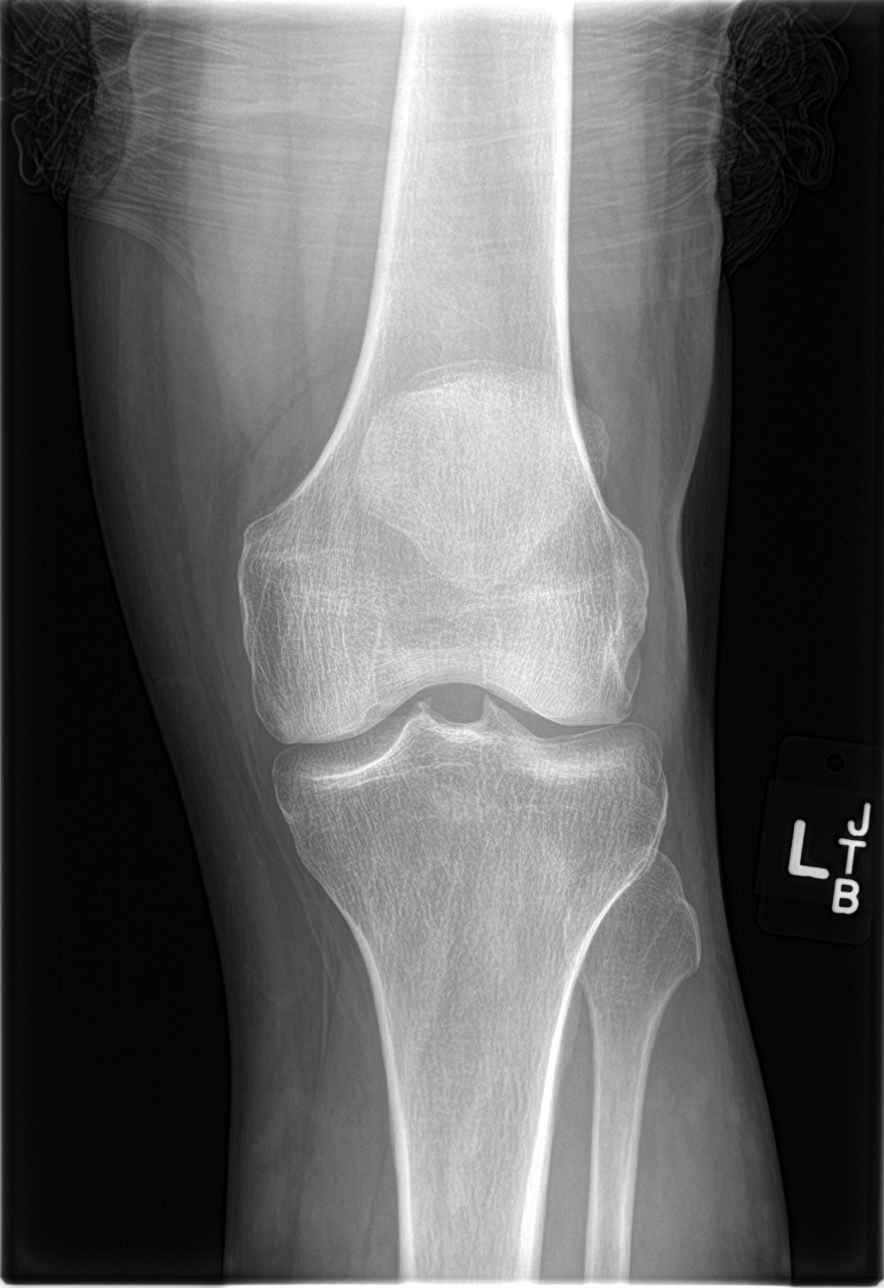

[knee lat]
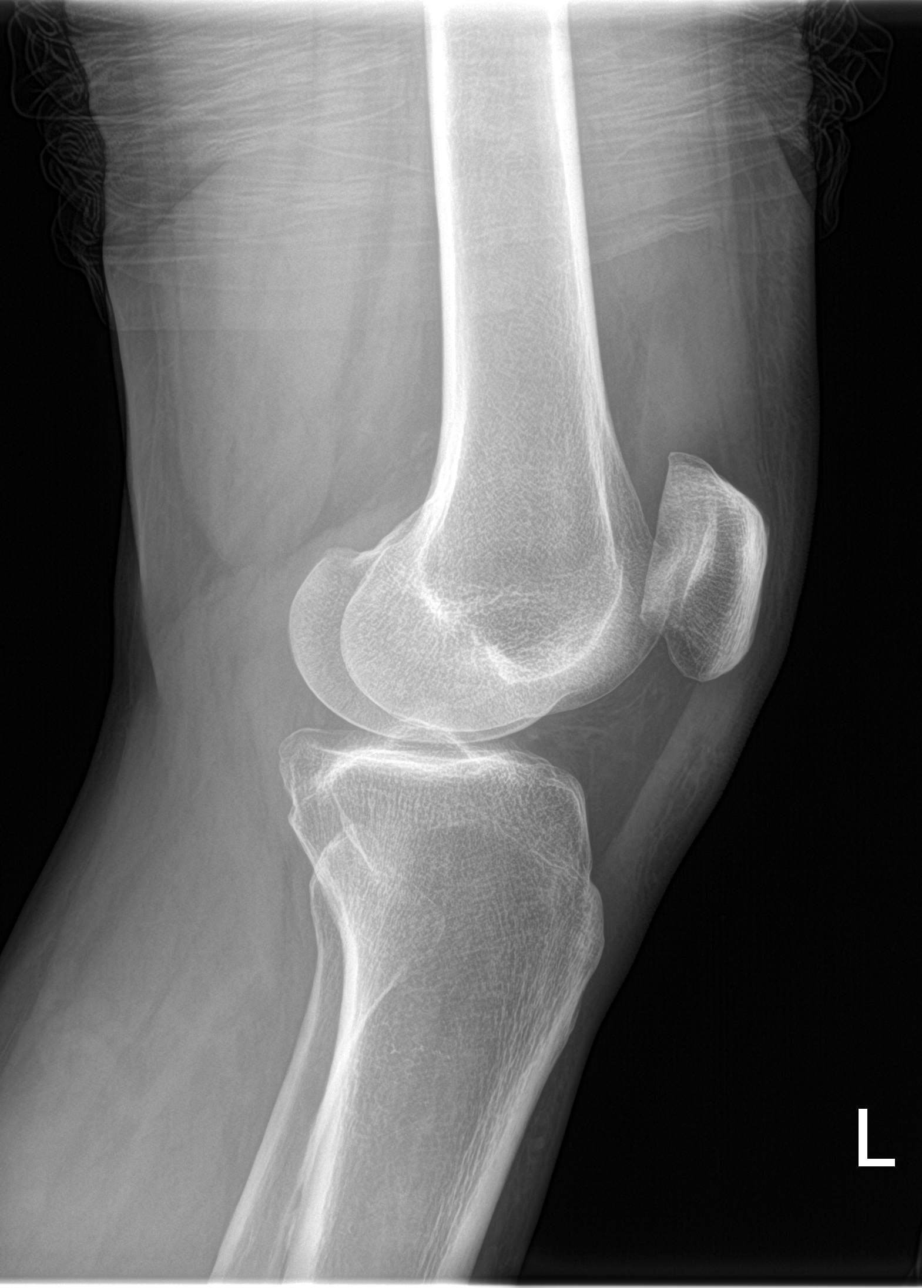

[knee obl (1 of 2)]
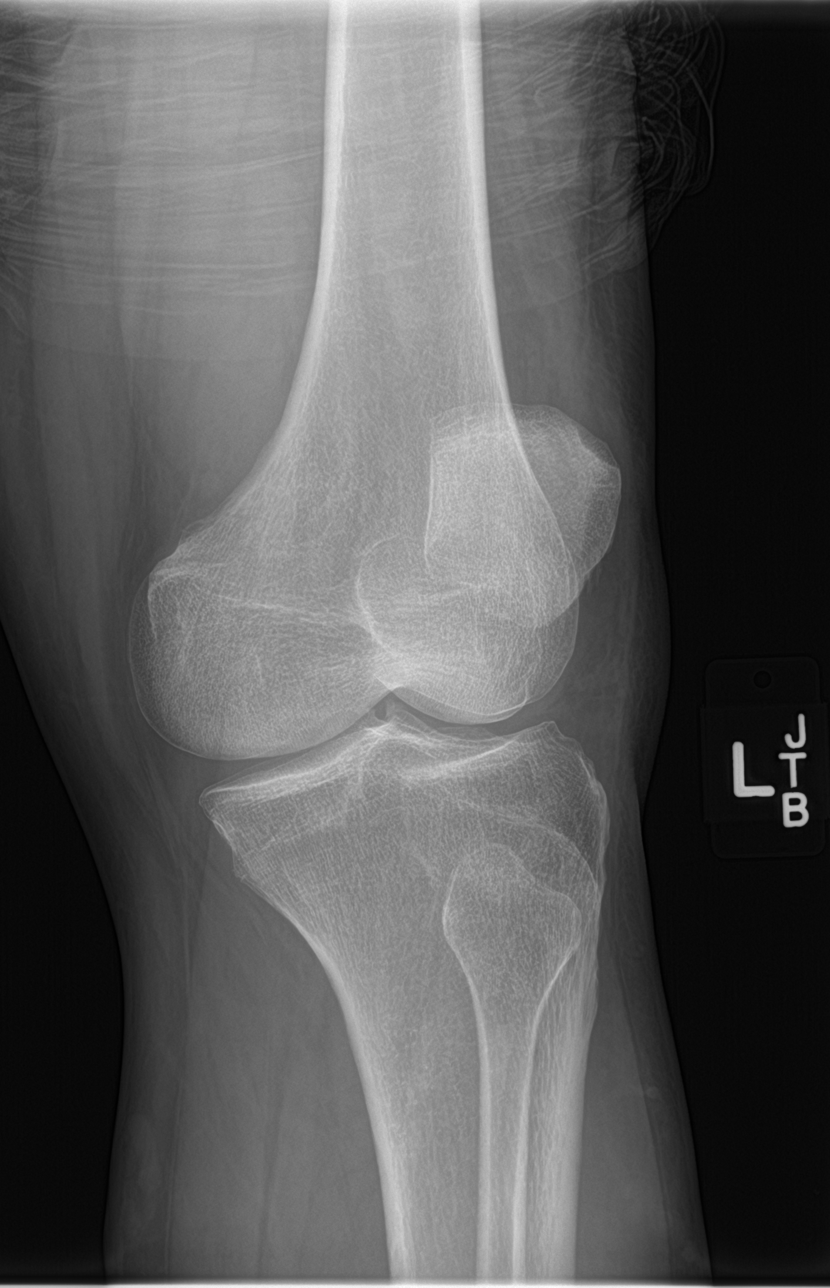

[knee obl (2 of 2)]
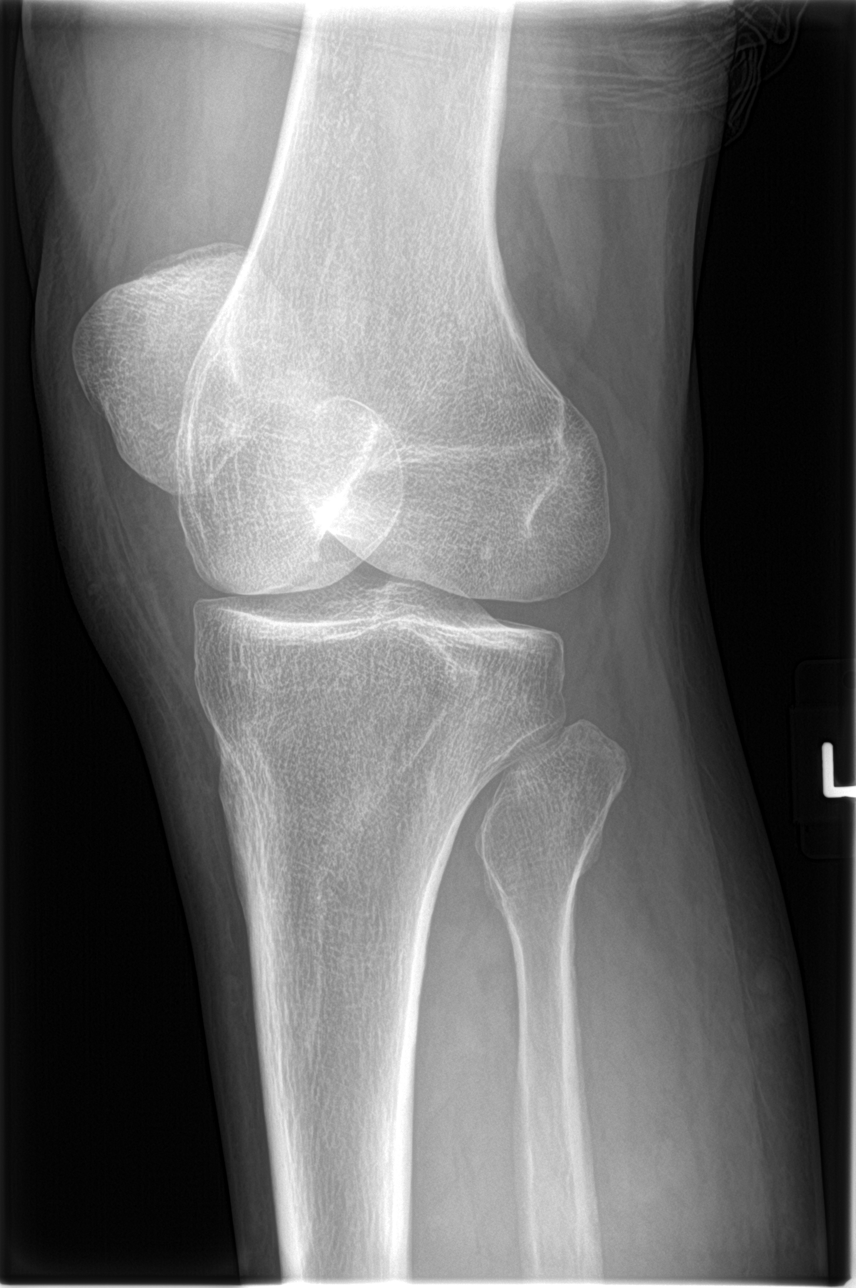

[4 of 4 positions shown; findings below may reference images not displayed]

FINDINGS: No evidence of fracture, dislocation, or joint effusion. No evidence
of arthropathy or other focal bone abnormality. Soft tissues are
unremarkable.
IMPRESSION: Negative.

## 2020-07-23 MED FILL — LISINOPRIL 5 MG TABLET: 5 | 90 days supply | Qty: 90 | Fill #2

## 2020-07-23 MED FILL — ATORVASTATIN 10 MG TABLET: 10 | 30 days supply | Qty: 30 | Fill #2

## 2020-07-23 MED FILL — GLIMEPIRIDE 2 MG TABS: 2 | 30 days supply | Qty: 30 | Fill #2

## 2020-08-25 ENCOUNTER — Other Ambulatory Visit: Payer: Self-pay | Admitting: Family Medicine

## 2020-08-25 ENCOUNTER — Other Ambulatory Visit: Payer: Self-pay | Admitting: Nurse Practitioner

## 2020-08-25 DIAGNOSIS — E119 Type 2 diabetes mellitus without complications: Secondary | ICD-10-CM

## 2020-08-25 MED FILL — GLIMEPIRIDE 2 MG TABS: 2 | 30 days supply | Qty: 30 | Fill #0

## 2020-08-25 MED FILL — ATORVASTATIN 10 MG TABLET: 10 | 30 days supply | Qty: 30 | Fill #3

## 2020-08-25 NOTE — Telephone Encounter (Signed)
Requested Prescriptions  Pending Prescriptions Disp Refills  . glimepiride (AMARYL) 2 MG tablet [Pharmacy Med Name: GLIMEPIRIDE 2 MG TABS 2 Tablet] 30 tablet 0    Sig: TAKE 1 TABLET (2 MG TOTAL) BY MOUTH DAILY BEFORE BREAKFAST.     Endocrinology:  Diabetes - Sulfonylureas Failed - 08/25/2020  8:08 AM      Failed - HBA1C is between 0 and 7.9 and within 180 days    Hgb A1c MFr Bld  Date Value Ref Range Status  01/06/2020 7.1 (H) 4.8 - 5.6 % Final    Comment:             Prediabetes: 5.7 - 6.4          Diabetes: >6.4          Glycemic control for adults with diabetes: <7.0          Failed - Valid encounter within last 6 months    Recent Outpatient Visits          7 months ago Type 2 diabetes mellitus without complication, without long-term current use of insulin [facility])   [place] [facility] [place], [place] W, NP   11 months ago Type 2 diabetes mellitus without complication, without long-term current use of insulin [facility])   [facility] [place], [place] W, NP   1 year ago Type 2 diabetes mellitus without complication, without long-term current use of insulin [facility])   [place] [facility] [place], [place] W, NP   2 years ago Type 2 diabetes mellitus without complication, without long-term current use of insulin [facility])   [facility] [place], [place] W, NP   2 years ago Type 2 diabetes mellitus without complication, without long-term current use of insulin [facility])    [facility] [place], [place] Stakes, NP              Attempted to call pt.  Left vm to return call to schedule a follow-up appt. With PCP.  Will give #30 as courtesy refill.

## 2020-08-27 MED FILL — TRUE METRIX TEST STRIP: 50 days supply | Qty: 100 | Fill #1

## 2020-10-05 ENCOUNTER — Other Ambulatory Visit: Payer: Self-pay | Admitting: Family Medicine

## 2020-10-05 ENCOUNTER — Other Ambulatory Visit: Payer: Self-pay | Admitting: Nurse Practitioner

## 2020-10-05 DIAGNOSIS — E119 Type 2 diabetes mellitus without complications: Secondary | ICD-10-CM

## 2020-10-05 DIAGNOSIS — E785 Hyperlipidemia, unspecified: Secondary | ICD-10-CM

## 2020-10-05 MED FILL — ATORVASTATIN 10 MG TABLET: 10 | 30 days supply | Qty: 30 | Fill #0

## 2020-10-05 MED FILL — LISINOPRIL 5 MG TABLET: 5 | 30 days supply | Qty: 30 | Fill #3

## 2020-10-06 ENCOUNTER — Telehealth: Payer: Self-pay | Admitting: Nurse Practitioner

## 2020-10-06 DIAGNOSIS — E119 Type 2 diabetes mellitus without complications: Secondary | ICD-10-CM

## 2020-10-06 NOTE — Telephone Encounter (Signed)
Pt is requesting a refill for glimepiride (AMARYL) 2 MG tablet [[id number]]    Pt uses the Pharmacy at [facility].  Please inform pt when available .  Please advise  and thank you

## 2020-10-13 NOTE — Telephone Encounter (Signed)
Attempt to reach patient to inform he can get his labs done. CMA will refill her one month supply till his appt. W/ Meredeth Ide.

## 2020-10-14 MED ORDER — GLIMEPIRIDE 2 MG PO TABS: ORAL_TABLET | ORAL | 0 refills | Status: DC

## 2020-10-14 MED FILL — GLIMEPIRIDE 2 MG TABS: 2 | 30 days supply | Qty: 30 | Fill #0

## 2020-11-20 ENCOUNTER — Other Ambulatory Visit: Payer: Self-pay

## 2020-11-20 ENCOUNTER — Encounter: Payer: Self-pay | Admitting: Nurse Practitioner

## 2020-11-20 ENCOUNTER — Ambulatory Visit: Payer: PRIVATE HEALTH INSURANCE | Attending: Nurse Practitioner | Admitting: Nurse Practitioner

## 2020-11-20 ENCOUNTER — Other Ambulatory Visit: Payer: Self-pay | Admitting: Nurse Practitioner

## 2020-11-20 VITALS — BP 145/86 | HR 71 | Temp 98.5°F | Ht 72.0 in | Wt 208.0 lb

## 2020-11-20 DIAGNOSIS — E785 Hyperlipidemia, unspecified: Secondary | ICD-10-CM

## 2020-11-20 DIAGNOSIS — M25562 Pain in left knee: Secondary | ICD-10-CM

## 2020-11-20 DIAGNOSIS — E119 Type 2 diabetes mellitus without complications: Secondary | ICD-10-CM

## 2020-11-20 DIAGNOSIS — G8929 Other chronic pain: Secondary | ICD-10-CM

## 2020-11-20 DIAGNOSIS — Z23 Encounter for immunization: Secondary | ICD-10-CM

## 2020-11-20 DIAGNOSIS — I10 Essential (primary) hypertension: Secondary | ICD-10-CM

## 2020-11-20 DIAGNOSIS — Z130 Encounter for screening for diseases of the blood and blood-forming organs and certain disorders involving the immune mechanism: Secondary | ICD-10-CM

## 2020-11-20 LAB — POCT GLYCOSYLATED HEMOGLOBIN (HGB A1C): Hemoglobin A1C: 7.3 % — AB (ref 4.0–5.6)

## 2020-11-20 LAB — GLUCOSE, POCT (MANUAL RESULT ENTRY): POC Glucose: 96 mg/dl (ref 70–99)

## 2020-11-20 MED ORDER — METFORMIN HCL 500 MG PO TABS
500.0000 mg | ORAL_TABLET | Freq: Two times a day (BID) | ORAL | 1 refills | Status: DC
Start: 2020-11-20 — End: 2021-02-19

## 2020-11-20 MED ORDER — TRUE METRIX BLOOD GLUCOSE TEST VI STRP: ORAL_STRIP | 12 refills | Status: DC

## 2020-11-20 MED ORDER — LISINOPRIL 5 MG PO TABS: 5.0000 mg | ORAL_TABLET | Freq: Every day | ORAL | 3 refills | Status: DC

## 2020-11-20 MED ORDER — MELOXICAM 7.5 MG PO TABS
7.5000 mg | ORAL_TABLET | Freq: Every day | ORAL | 1 refills | Status: DC
Start: 2020-11-20 — End: 2021-03-25

## 2020-11-20 MED ORDER — GLIMEPIRIDE 2 MG PO TABS
2.0000 mg | ORAL_TABLET | Freq: Every day | ORAL | 1 refills | Status: DC
Start: 2020-11-20 — End: 2021-02-19

## 2020-11-20 MED ORDER — ATORVASTATIN CALCIUM 10 MG PO TABS
ORAL_TABLET | ORAL | 1 refills | Status: DC
Start: 2020-11-20 — End: 2021-02-19

## 2020-11-20 NOTE — Progress Notes (Signed)
Assessment & Plan:  Laith was seen today for diabetes.  Diagnoses and all orders for this visit:  Type 2 diabetes mellitus without complication, without long-term current use of insulin (HCC) -     Glucose (CBG) -     HgB A1c -     Microalbumin/Creatinine Ratio, Urine -     glimepiride (AMARYL) 2 MG tablet; Take 1 tablet (2 mg total) by mouth daily with breakfast. Please fill as a 90 day supply -     metFORMIN (GLUCOPHAGE) 500 MG tablet; Take 1 tablet (500 mg total) by mouth 2 (two) times daily with a meal. Please fill as a 90 day supply -     glucose blood (TRUE METRIX BLOOD GLUCOSE TEST) test strip; Use as instructed. Check blood glucose level by fingerstick once per day. E11.65 Continue blood sugar control as discussed in office today, low carbohydrate diet, and regular physical exercise as tolerated, 150 minutes per week (30 min each day, 5 days per week, or 50 min 3 days per week). Keep blood sugar logs with fasting goal of 90-130 mg/dl, post prandial (after you eat) less than 180.  For Hypoglycemia: BS <60 and Hyperglycemia BS >400; contact the clinic ASAP. Annual eye exams and foot exams are recommended.   Hyperlipidemia, unspecified hyperlipidemia type -     atorvastatin (LIPITOR) 10 MG tablet; TAKE 1 TABLET (10 MG TOTAL) BY MOUTH DAILY. Please fill as a 90 day supply INSTRUCTIONS: Work on a low fat, heart healthy diet and participate in regular aerobic exercise program by working out at least 150 minutes per week; 5 days a week-30 minutes per day. Avoid red meat/beef/steak,  fried foods. junk foods, sodas, sugary drinks, unhealthy snacking, alcohol and smoking.  Drink at least 80 oz of water per day and monitor your carbohydrate intake daily.   Chronic pain of left knee -     meloxicam (MOBIC) 7.5 MG tablet; Take 1 tablet (7.5 mg total) by mouth daily. Work on losing weight to help reduce joint pain. May alternate with heat and ice application for pain relief. May also alternate  with acetaminophen as prescribed pain relief. Other alternatives include massage, acupuncture and water aerobics.  You must stay active and avoid a sedentary lifestyle.  Essential hypertension -     lisinopril (ZESTRIL) 5 MG tablet; Take 1 tablet (5 mg total) by mouth daily. Please fill as a 90 day supply Continue all antihypertensives as prescribed.  Remember to bring in your blood pressure log with you for your follow up appointment.  DASH/Mediterranean Diets are healthier choices for HTN.    Need for immunization against influenza -     Flu Vaccine QUAD 36+ mos IM    Patient has been counseled on age-appropriate routine health concerns for screening and prevention. These are reviewed and up-to-date. Referrals have been placed accordingly. Immunizations are up-to-date or declined.    Subjective:   Chief Complaint  Patient presents with  . Diabetes    Patient is here to follow up on diabetes.    HPI Carl Hendricks 60 y.o. male presents to office today for follow up. VRI was used to communicate directly with patient for the entire encounter including providing detailed patient instructions.     DM 2 He is monitoring his blood glucose levels at home with average readings 90-250. He is currently taking metformin twice per day 500 mg. He is on renal dose ACE and STATIN. Adding amaryl 2 mg today. LDL not at goal.  Lab Results  Component Value Date   HGBA1C 7.3 (A) 11/20/2020    Lab Results  Component Value Date   HGBA1C 7.1 (H) 01/06/2020   Lab Results  Component Value Date   LDLCALC 79 11/20/2020   BP Readings from Last 3 Encounters:  11/20/20 (!) 145/86  09/30/19 126/73  11/12/18 (!) 143/86   Chronic Knee Pain Taking meloxicam as prescribed with is effective in relieving his pain.   Review of Systems  Constitutional: Negative for fever, malaise/fatigue and weight loss.  HENT: Negative.  Negative for nosebleeds.   Eyes: Negative.  Negative for blurred vision, double  vision and photophobia.  Respiratory: Negative.  Negative for cough and shortness of breath.   Cardiovascular: Negative.  Negative for chest pain, palpitations and leg swelling.  Gastrointestinal: Negative.  Negative for heartburn, nausea and vomiting.  Musculoskeletal: Positive for joint pain. Negative for myalgias.  Neurological: Negative.  Negative for dizziness, focal weakness, seizures and headaches.  Psychiatric/Behavioral: Negative.  Negative for suicidal ideas.    Past Medical History:  Diagnosis Date  . Diabetes mellitus without complication (HCC)   . Hyperlipidemia     No past surgical history on file.  Family History  Problem Relation Age of Onset  . Diabetes Mother   . Diabetes Father   . Diabetes Sister     Social History Reviewed with no changes to be made today.   Outpatient Medications Prior to Visit  Medication Sig Dispense Refill  . acetaminophen (TYLENOL) 500 MG tablet Take 2 tablets (1,000 mg total) by mouth every 6 (six) hours as needed. 30 tablet 0  . Misc. Devices MISC Please provide patient with insurance approved glucometer, strips and lancets. E11.65. Instructions: monitor blood glucose levels twice daily. Strips quantity 100 with 6 refills. Lancets quantity 100 with 6 refills. 1 each 0  . atorvastatin (LIPITOR) 10 MG tablet TAKE 1 TABLET (10 MG TOTAL) BY MOUTH DAILY. 30 tablet 1  . glimepiride (AMARYL) 2 MG tablet TAKE 1 TABLET (2 MG TOTAL) BY MOUTH DAILY BEFORE BREAKFAST. 30 tablet 0  . lisinopril (ZESTRIL) 5 MG tablet Take 1 tablet (5 mg total) by mouth daily. Please fill as a 90 day supply 90 tablet 3  . meloxicam (MOBIC) 7.5 MG tablet Take 1 tablet (7.5 mg total) by mouth daily. 30 tablet 0  . metFORMIN (GLUCOPHAGE) 500 MG tablet Take 1 tablet (500 mg total) by mouth 2 (two) times daily with a meal. Please fill as a 90 day supply 180 tablet 1   No facility-administered medications prior to visit.    No Known Allergies     Objective:    BP (!)  145/86 (BP Location: Left Arm, Patient Position: Sitting, Cuff Size: Normal)   Pulse 71   Temp 98.5 F (36.9 C) (Oral)   Ht 6' (1.829 m)   Wt 208 lb (94.3 kg)   SpO2 99%   BMI 28.21 kg/m  Wt Readings from Last 3 Encounters:  11/20/20 208 lb (94.3 kg)  09/30/19 208 lb 12.8 oz (94.7 kg)  11/12/18 204 lb 3.2 oz (92.6 kg)    Physical Exam Vitals and nursing note reviewed.  Constitutional:      Appearance: He is well-developed and well-nourished.  HENT:     Head: Normocephalic and atraumatic.  Eyes:     Extraocular Movements: EOM normal.  Cardiovascular:     Rate and Rhythm: Normal rate and regular rhythm.     Pulses: Intact distal pulses.     Heart  sounds: Normal heart sounds. No murmur heard. No friction rub. No gallop.   Pulmonary:     Effort: Pulmonary effort is normal. No tachypnea or respiratory distress.     Breath sounds: Normal breath sounds. No decreased breath sounds, wheezing, rhonchi or rales.  Chest:     Chest wall: No tenderness.  Abdominal:     General: Bowel sounds are normal.     Palpations: Abdomen is soft.  Musculoskeletal:        General: No edema. Normal range of motion.     Cervical back: Normal range of motion.  Skin:    General: Skin is warm and dry.  Neurological:     Mental Status: He is alert and oriented to person, place, and time.     Coordination: Coordination normal.  Psychiatric:        Mood and Affect: Mood and affect normal.        Behavior: Behavior normal. Behavior is cooperative.        Thought Content: Thought content normal.        Judgment: Judgment normal.          Patient has been counseled extensively about nutrition and exercise as well as the importance of adherence with medications and regular follow-up. The patient was given clear instructions to go to ER or return to medical center if symptoms don't improve, worsen or new problems develop. The patient verbalized understanding.   Follow-up: Return in about 3 weeks  (around 12/11/2020) for BP CHECK WITH LUKE. see me in 3 months.   Claiborne Rigg, FNP-BC [facility] [place], [place] [phone]   11/25/2020, 11:07 PM

## 2020-11-21 LAB — LIPID PANEL
Chol/HDL Ratio: 4.7 ratio (ref 0.0–5.0)
Cholesterol, Total: 141 mg/dL (ref 100–199)
HDL: 30 mg/dL — ABNORMAL LOW (ref >39)
LDL Chol Calc (NIH): 79 mg/dL (ref 0–99)
Triglycerides: 188 mg/dL — ABNORMAL HIGH (ref 0–149)
VLDL Cholesterol Cal: 32 mg/dL (ref 5–40)

## 2020-11-21 LAB — CMP14+EGFR
ALT: 24 IU/L (ref 0–44)
AST: 21 IU/L (ref 0–40)
Albumin/Globulin Ratio: 1.5 (ref 1.2–2.2)
Albumin: 4.5 g/dL (ref 3.8–4.9)
Alkaline Phosphatase: 92 IU/L (ref 44–121)
BUN/Creatinine Ratio: 13 (ref 10–24)
BUN: 14 mg/dL (ref 8–27)
Bilirubin Total: 0.5 mg/dL (ref 0.0–1.2)
CO2: 29 mmol/L (ref 20–29)
Calcium: 9.6 mg/dL (ref 8.6–10.2)
Chloride: 100 mmol/L (ref 96–106)
Creatinine, Ser: 1.08 mg/dL (ref 0.76–1.27)
GFR calc Af Amer: 86 mL/min/{1.73_m2} (ref >59)
GFR calc non Af Amer: 74 mL/min/{1.73_m2} (ref >59)
Globulin, Total: 3.1 g/dL (ref 1.5–4.5)
Glucose: 98 mg/dL (ref 65–99)
Potassium: 4.5 mmol/L (ref 3.5–5.2)
Sodium: 140 mmol/L (ref 134–144)
Total Protein: 7.6 g/dL (ref 6.0–8.5)

## 2020-11-21 LAB — CBC
Hematocrit: 45.4 % (ref 37.5–51.0)
Hemoglobin: 15.9 g/dL (ref 13.0–17.7)
MCH: 30.2 pg (ref 26.6–33.0)
MCHC: 35 g/dL (ref 31.5–35.7)
MCV: 86 fL (ref 79–97)
Platelets: 181 10*3/uL (ref 150–450)
RBC: 5.26 x10E6/uL (ref 4.14–5.80)
RDW: 12.1 % (ref 11.6–15.4)
WBC: 7.9 10*3/uL (ref 3.4–10.8)

## 2020-11-21 LAB — MICROALBUMIN / CREATININE URINE RATIO
Creatinine, Urine: 32.6 mg/dL
Microalb/Creat Ratio: 12 mg/g{creat} (ref 0–29)
Microalbumin, Urine: 3.8 ug/mL

## 2020-11-23 ENCOUNTER — Other Ambulatory Visit: Payer: Self-pay | Admitting: Pharmacist

## 2020-11-23 MED ORDER — ACCU-CHEK GUIDE ME W/DEVICE KIT: PACK | 0 refills | Status: DC

## 2020-11-23 MED ORDER — ACCU-CHEK SOFTCLIX LANCETS MISC: 2 refills | Status: DC

## 2020-11-23 MED FILL — MELOXICAM 7.5 MG TABLET: 7.5 | 30 days supply | Qty: 30 | Fill #0

## 2020-11-23 MED FILL — ACCU-CHEK GUIDE ME W/DEVICE: W/DEVICE | 1 days supply | Qty: 1 | Fill #0

## 2020-11-23 MED FILL — ACCU-CHEK GUIDE TEST STRIP: 30 days supply | Qty: 50 | Fill #0

## 2020-11-23 MED FILL — LISINOPRIL 5 MG TABLET: 5 | 30 days supply | Qty: 30 | Fill #0

## 2020-11-23 MED FILL — GLIMEPIRIDE 2 MG TABS: 2 | 30 days supply | Qty: 30 | Fill #0

## 2020-11-23 MED FILL — ACCU-CHEK SOFTCLIX LANCETS: 30 days supply | Qty: 100 | Fill #0

## 2020-11-23 MED FILL — ATORVASTATIN 10 MG TABLET: 10 | 30 days supply | Qty: 30 | Fill #0

## 2020-11-23 MED FILL — METFORMIN HCL 500 MG TABS: 500 | 30 days supply | Qty: 60 | Fill #0

## 2020-11-25 ENCOUNTER — Encounter: Payer: Self-pay | Admitting: Nurse Practitioner

## 2020-12-18 ENCOUNTER — Other Ambulatory Visit: Payer: Self-pay

## 2020-12-18 ENCOUNTER — Encounter: Payer: Self-pay | Admitting: Pharmacist

## 2020-12-18 ENCOUNTER — Ambulatory Visit: Payer: BC Managed Care – PPO | Attending: Nurse Practitioner | Admitting: Pharmacist

## 2020-12-18 VITALS — BP 143/89

## 2020-12-18 DIAGNOSIS — I10 Essential (primary) hypertension: Secondary | ICD-10-CM | POA: Diagnosis not present

## 2020-12-18 MED ORDER — LISINOPRIL 10 MG PO TABS: 10.0000 mg | ORAL_TABLET | Freq: Every day | ORAL | 1 refills | Status: DC

## 2020-12-18 MED FILL — LISINOPRIL 10 MG TABS: 10 | 30 days supply | Qty: 30 | Fill #0

## 2020-12-18 NOTE — Progress Notes (Signed)
   S:    PCP: Zelda   Patient arrives in good spirits. Presents to the clinic for hypertension evaluation, counseling, and management. Patient was referred and last seen by Primary Care Provider on 11/20/2020.    Medication adherence reported. Today, he denies chest pain, dyspnea, HA or blurred vision. Has taken his medication today.   Current BP Medications include:  Lisinopril 5 mg daily   Antihypertensives tried in the past include: none other than lisinopril  Dietary habits include: not fully compliant with salt restriction; denies drinking caffeine  Exercise habits include: none; does walk daily but not for an extended amount of time Family / Social history:  - FHx: DM - Tobacco: never smoker  - Alcohol: denies use  O:  Vitals:   12/18/20 1525  BP: (!) 143/89   Home BP readings: none   Last 3 Office BP readings: BP Readings from Last 3 Encounters:  12/18/20 (!) 143/89  11/20/20 (!) 145/86  09/30/19 126/73   BMET    Component Value Date/Time   NA 140 11/20/2020 1625   K 4.5 11/20/2020 1625   CL 100 11/20/2020 1625   CO2 29 11/20/2020 1625   GLUCOSE 98 11/20/2020 1625   GLUCOSE 82 07/14/2018 1616   BUN 14 11/20/2020 1625   CREATININE 1.08 11/20/2020 1625   CALCIUM 9.6 11/20/2020 1625   GFRNONAA 74 11/20/2020 1625   GFRAA 86 11/20/2020 1625   Renal function: CrCl cannot be calculated (Patient's most recent lab result is older than the maximum 21 days allowed.).  Clinical ASCVD: No  The 10-year ASCVD risk score Denman George DC Jr., et al., 2013) is: 30%   Values used to calculate the score:     Age: 24 years     Sex: Male     Is Non-Hispanic African American: Yes     Diabetic: Yes     Tobacco smoker: No     Systolic Blood Pressure: 143 mmHg     Is BP treated: Yes     HDL Cholesterol: 30 mg/dL     Total Cholesterol: 141 mg/dL  A/P: Hypertension longstanding currently above goal on current medications. BP Goal = < 130/80 mmHg. Medication adherence reported.   -Increased dose of lisinopril to 10 mg daily.  -Counseled on lifestyle modifications for blood pressure control including reduced dietary sodium, increased exercise, adequate sleep.  Results reviewed and written information provided.   Total time in face-to-face counseling 15 minutes.   F/U Clinic Visit in 1 month.   Butch Penny, PharmD, [place], CPP Clinical Pharmacist [facility] [phone]

## 2020-12-29 MED FILL — GLIMEPIRIDE 2 MG TABS: 2 | 30 days supply | Qty: 30 | Fill #1

## 2020-12-29 MED FILL — ATORVASTATIN 10 MG TABLET: 10 | 30 days supply | Qty: 30 | Fill #1

## 2021-01-15 ENCOUNTER — Ambulatory Visit: Payer: PRIVATE HEALTH INSURANCE | Admitting: Pharmacist

## 2021-02-19 ENCOUNTER — Encounter: Payer: Self-pay | Admitting: Nurse Practitioner

## 2021-02-19 ENCOUNTER — Ambulatory Visit: Payer: PRIVATE HEALTH INSURANCE | Attending: Nurse Practitioner | Admitting: Nurse Practitioner

## 2021-02-19 ENCOUNTER — Other Ambulatory Visit: Payer: Self-pay

## 2021-02-19 VITALS — BP 124/79 | HR 70 | Resp 18 | Ht 72.0 in | Wt 199.0 lb

## 2021-02-19 DIAGNOSIS — I10 Essential (primary) hypertension: Secondary | ICD-10-CM

## 2021-02-19 DIAGNOSIS — E785 Hyperlipidemia, unspecified: Secondary | ICD-10-CM

## 2021-02-19 DIAGNOSIS — L219 Seborrheic dermatitis, unspecified: Secondary | ICD-10-CM

## 2021-02-19 DIAGNOSIS — Z1211 Encounter for screening for malignant neoplasm of colon: Secondary | ICD-10-CM

## 2021-02-19 DIAGNOSIS — E119 Type 2 diabetes mellitus without complications: Secondary | ICD-10-CM | POA: Diagnosis not present

## 2021-02-19 LAB — POCT GLYCOSYLATED HEMOGLOBIN (HGB A1C): HbA1c, POC (controlled diabetic range): 6.8 % (ref 0.0–7.0)

## 2021-02-19 LAB — GLUCOSE, POCT (MANUAL RESULT ENTRY): POC Glucose: 84 mg/dl (ref 70–99)

## 2021-02-19 MED ORDER — SELENIUM SULFIDE 2.5 % EX LOTN
1.0000 "application " | TOPICAL_LOTION | Freq: Every day | CUTANEOUS | 3 refills | Status: DC | PRN
  Filled 2021-02-19 – 2021-03-16 (×3): qty 118, 23d supply, fill #0

## 2021-02-19 MED ORDER — LISINOPRIL 10 MG PO TABS
10.0000 mg | ORAL_TABLET | Freq: Every day | ORAL | 1 refills | Status: DC
  Filled 2021-02-19 – 2021-03-05 (×2): qty 30, 30d supply, fill #0

## 2021-02-19 MED ORDER — GLIMEPIRIDE 2 MG PO TABS
2.0000 mg | ORAL_TABLET | Freq: Every day | ORAL | 1 refills | Status: DC
  Filled 2021-02-19 – 2021-03-05 (×2): qty 30, 30d supply, fill #0

## 2021-02-19 MED ORDER — METFORMIN HCL 500 MG PO TABS
500.0000 mg | ORAL_TABLET | Freq: Two times a day (BID) | ORAL | 1 refills | Status: DC
Start: 2021-02-19 — End: 2021-04-15
  Filled 2021-02-19 – 2021-03-05 (×2): qty 60, 30d supply, fill #0

## 2021-02-19 MED ORDER — ATORVASTATIN CALCIUM 10 MG PO TABS
ORAL_TABLET | ORAL | 1 refills | Status: DC
  Filled 2021-02-19 – 2021-03-05 (×2): qty 30, 30d supply, fill #0

## 2021-02-19 NOTE — Progress Notes (Signed)
Assessment & Plan:  Carl Hendricks was seen today for diabetes.  Diagnoses and all orders for this visit:  Type 2 diabetes mellitus without complication, without long-term current use of insulin (HCC) -     POCT glucose (manual entry) -     POCT glycosylated hemoglobin (Hb A1C) -     metFORMIN (GLUCOPHAGE) 500 MG tablet; Take 1 tablet (500 mg total) by mouth 2 (two) times daily with a meal. Please fill as a 90 day supply -     glimepiride (AMARYL) 2 MG tablet; Take 1 tablet (2 mg total) by mouth daily with breakfast. -     Basic metabolic panel Continue blood sugar control as discussed in office today, low carbohydrate diet, and regular physical exercise as tolerated, 150 minutes per week (30 min each day, 5 days per week, or 50 min 3 days per week). Keep blood sugar logs with fasting goal of 90-130 mg/dl, post prandial (after you eat) less than 180.  For Hypoglycemia: BS <60 and Hyperglycemia BS >400; contact the clinic ASAP. Annual eye exams and foot exams are recommended.   Hyperlipidemia, unspecified hyperlipidemia type -     atorvastatin (LIPITOR) 10 MG tablet; TAKE 1 TABLET (10 MG TOTAL) BY MOUTH DAILY. Please fill as a 90 day supply INSTRUCTIONS: Work on a low fat, heart healthy diet and participate in regular aerobic exercise program by working out at least 150 minutes per week; 5 days a week-30 minutes per day. Avoid red meat/beef/steak,  fried foods. junk foods, sodas, sugary drinks, unhealthy snacking, alcohol and smoking.  Drink at least 80 oz of water per day and monitor your carbohydrate intake daily.   Seborrheic dermatitis -     selenium sulfide (SELSUN) 2.5 % shampoo; Apply 1 application topically daily as needed for irritation.  Colon cancer screening -     Fecal occult blood, imunochemical([facility])  Primary hypertension -     lisinopril (ZESTRIL) 10 MG tablet; Take 1 tablet (10 mg total) by mouth daily. Please fill as a 90 day supply Continue all antihypertensives  as prescribed.  Remember to bring in your blood pressure log with you for your follow up appointment.  DASH/Mediterranean Diets are healthier choices for HTN.      Patient has been counseled on age-appropriate routine health concerns for screening and prevention. These are reviewed and up-to-date. Referrals have been placed accordingly. Immunizations are up-to-date or declined.    Subjective:   Chief Complaint  Patient presents with  . Diabetes   HPI Carl Hendricks 60 y.o. male presents to office today for follow up.  He as a past medical history of DM2, HTN and Hyperlipidemia.   Essential Hypertension Blood pressure is well controlled with lisinopril 10 mg daily. Denies chest pain, shortness of breath, palpitations, lightheadedness, dizziness, headaches or BLE edema.  BP Readings from Last 3 Encounters:  02/19/21 124/79  12/18/20 (!) 143/89  11/20/20 (!) 145/86   DM2 Well controlled. He is taking metformin 500 mg BID and glimepiride 2 mg daily. Denies any symptoms of hypo or hyperglycemia. LDL not optimized however he endorses adherence taking atorvastatin 10 mg daily.  Lab Results  Component Value Date   HGBA1C 6.8 02/19/2021   Lab Results  Component Value Date   LDLCALC 79 11/20/2020    Seborrhea: Patient complains of seborrheic dermatitis. Patient complains of itching, scaling, rash on the scalp.  Symptoms have been ongoing for about a few years. Previous treatment has included OTC dandruff shampoo with fair improvement.  Review of Systems  Constitutional: Negative for fever, malaise/fatigue and weight loss.  HENT: Negative.  Negative for nosebleeds.   Eyes: Negative.  Negative for blurred vision, double vision and photophobia.  Respiratory: Negative.  Negative for cough and shortness of breath.   Cardiovascular: Negative.  Negative for chest pain, palpitations and leg swelling.  Gastrointestinal: Negative.  Negative for heartburn, nausea and vomiting.   Musculoskeletal: Negative.  Negative for myalgias.  Skin: Positive for itching and rash.  Neurological: Negative.  Negative for dizziness, focal weakness, seizures and headaches.  Psychiatric/Behavioral: Negative.  Negative for suicidal ideas.    Past Medical History:  Diagnosis Date  . Diabetes mellitus without complication ([place])   . Hyperlipidemia     No past surgical history on file.  Family History  Problem Relation Age of Onset  . Diabetes Mother   . Diabetes Father   . Diabetes Sister     Social History Reviewed with no changes to be made today.   Outpatient Medications Prior to Visit  Medication Sig Dispense Refill  . Accu-Chek Softclix Lancets lancets Use to check blood sugar once daily. 100 each 2  . acetaminophen (TYLENOL) 500 MG tablet Take 2 tablets (1,000 mg total) by mouth every 6 (six) hours as needed. 30 tablet 0  . Blood Glucose Monitoring Suppl (ACCU-CHEK GUIDE ME) w/Device KIT USE TO CHECK BLOOD SUGAR ONCE DAILY. 1 kit 0  . glucose blood (TRUE METRIX BLOOD GLUCOSE TEST) test strip Use as instructed. Check blood glucose level by fingerstick once per day. E11.65 100 each 12  . meloxicam (MOBIC) 7.5 MG tablet Take 1 tablet (7.5 mg total) by mouth daily. 30 tablet 1  . Misc. Devices MISC Please provide patient with insurance approved glucometer, strips and lancets. E11.65. Instructions: monitor blood glucose levels twice daily. Strips quantity 100 with 6 refills. Lancets quantity 100 with 6 refills. 1 each 0  . atorvastatin (LIPITOR) 10 MG tablet TAKE 1 TABLET (10 MG TOTAL) BY MOUTH DAILY. Please fill as a 90 day supply 90 tablet 1  . lisinopril (ZESTRIL) 10 MG tablet Take 1 tablet (10 mg total) by mouth daily. 30 tablet 1  . glimepiride (AMARYL) 2 MG tablet Take 1 tablet (2 mg total) by mouth daily with breakfast. Please fill as a 90 day supply 90 tablet 1  . metFORMIN (GLUCOPHAGE) 500 MG tablet Take 1 tablet (500 mg total) by mouth 2 (two) times daily with a meal.  Please fill as a 90 day supply 180 tablet 1   No facility-administered medications prior to visit.    No Known Allergies     Objective:    BP 124/79   Pulse 70   Resp 18   Ht 6' (1.829 m)   Wt 199 lb (90.3 kg)   SpO2 95%   BMI 26.99 kg/m  Wt Readings from Last 3 Encounters:  02/19/21 199 lb (90.3 kg)  11/20/20 208 lb (94.3 kg)  09/30/19 208 lb 12.8 oz (94.7 kg)    Physical Exam Vitals and nursing note reviewed.  Constitutional:      Appearance: He is well-developed.  HENT:     Head: Normocephalic and atraumatic.  Cardiovascular:     Rate and Rhythm: Normal rate and regular rhythm.     Heart sounds: Normal heart sounds. No murmur heard. No friction rub. No gallop.   Pulmonary:     Effort: Pulmonary effort is normal. No tachypnea or respiratory distress.     Breath sounds: Normal breath sounds.  No decreased breath sounds, wheezing, rhonchi or rales.  Chest:     Chest wall: No tenderness.  Abdominal:     General: Bowel sounds are normal.     Palpations: Abdomen is soft.  Musculoskeletal:        General: Normal range of motion.     Cervical back: Normal range of motion.  Skin:    General: Skin is warm and dry.     Findings: Rash present. Rash is macular and scaling. Rash is not crusting.       Neurological:     Mental Status: He is alert and oriented to person, place, and time.     Coordination: Coordination normal.  Psychiatric:        Behavior: Behavior normal. Behavior is cooperative.        Thought Content: Thought content normal.        Judgment: Judgment normal.          Patient has been counseled extensively about nutrition and exercise as well as the importance of adherence with medications and regular follow-up. The patient was given clear instructions to go to ER or return to medical center if symptoms don't improve, worsen or new problems develop. The patient verbalized understanding.   Follow-up: Return in about 3 months (around 05/21/2021).    Gildardo Pounds, FNP-BC [facility] and [place], [place]   02/19/2021, 9:13 PM

## 2021-02-20 LAB — BASIC METABOLIC PANEL
BUN/Creatinine Ratio: 9 — ABNORMAL LOW (ref 10–24)
BUN: 11 mg/dL (ref 8–27)
CO2: 24 mmol/L (ref 20–29)
Calcium: 9.2 mg/dL (ref 8.6–10.2)
Chloride: 101 mmol/L (ref 96–106)
Creatinine, Ser: 1.2 mg/dL (ref 0.76–1.27)
Glucose: 75 mg/dL (ref 65–99)
Potassium: 4.2 mmol/L (ref 3.5–5.2)
Sodium: 140 mmol/L (ref 134–144)
eGFR: 69 mL/min/{1.73_m2} (ref >59)

## 2021-02-22 ENCOUNTER — Other Ambulatory Visit: Payer: Self-pay

## 2021-03-01 ENCOUNTER — Other Ambulatory Visit: Payer: Self-pay

## 2021-03-05 ENCOUNTER — Other Ambulatory Visit: Payer: Self-pay

## 2021-03-08 ENCOUNTER — Other Ambulatory Visit: Payer: Self-pay

## 2021-03-15 ENCOUNTER — Other Ambulatory Visit: Payer: Self-pay

## 2021-03-16 ENCOUNTER — Other Ambulatory Visit: Payer: Self-pay

## 2021-03-19 ENCOUNTER — Other Ambulatory Visit: Payer: Self-pay | Admitting: Family Medicine

## 2021-03-19 DIAGNOSIS — I10 Essential (primary) hypertension: Secondary | ICD-10-CM

## 2021-03-20 ENCOUNTER — Other Ambulatory Visit: Payer: Self-pay | Admitting: Nurse Practitioner

## 2021-03-20 DIAGNOSIS — E119 Type 2 diabetes mellitus without complications: Secondary | ICD-10-CM

## 2021-03-20 NOTE — Telephone Encounter (Signed)
Change of pharmacy Requested Prescriptions  Pending Prescriptions Disp Refills  . glimepiride (AMARYL) 2 MG tablet [Pharmacy Med Name: GLIMEPIRIDE 2 MG TABLET] 90 tablet 0    Sig: TAKE 1 TABLET BY MOUTH EVERY DAY WITH BREAKFAST     Endocrinology:  Diabetes - Sulfonylureas Passed - 03/20/2021  2:34 PM      Passed - HBA1C is between 0 and 7.9 and within 180 days    HbA1c, POC (controlled diabetic range)  Date Value Ref Range Status  02/19/2021 6.8 0.0 - 7.0 % Final         Passed - Valid encounter within last 6 months    Recent Outpatient Visits          4 weeks ago Type 2 diabetes mellitus without complication, without long-term current use of insulin (HCC)   [place] [facility] [place], [place] W, NP   3 months ago Essential hypertension   [place] [facility] [place], Jeannett Senior L, RPH-CPP   4 months ago Type 2 diabetes mellitus without complication, without long-term current use of insulin [facility])   [place] [facility] [place], [place] W, NP   1 year ago Type 2 diabetes mellitus without complication, without long-term current use of insulin [facility])   [place] [facility] [place], [place] W, NP   1 year ago Type 2 diabetes mellitus without complication, without long-term current use of insulin [facility])   [place] [facility] [place], Shea Stakes, NP

## 2021-03-25 ENCOUNTER — Other Ambulatory Visit: Payer: Self-pay | Admitting: Nurse Practitioner

## 2021-03-25 DIAGNOSIS — G8929 Other chronic pain: Secondary | ICD-10-CM

## 2021-04-15 ENCOUNTER — Other Ambulatory Visit: Payer: Self-pay | Admitting: Nurse Practitioner

## 2021-04-15 DIAGNOSIS — E119 Type 2 diabetes mellitus without complications: Secondary | ICD-10-CM

## 2021-04-15 DIAGNOSIS — E785 Hyperlipidemia, unspecified: Secondary | ICD-10-CM

## 2021-04-19 ENCOUNTER — Other Ambulatory Visit: Payer: Self-pay | Admitting: Nurse Practitioner

## 2021-04-19 DIAGNOSIS — I10 Essential (primary) hypertension: Secondary | ICD-10-CM

## 2021-05-21 ENCOUNTER — Other Ambulatory Visit: Payer: Self-pay | Admitting: Nurse Practitioner

## 2021-05-21 DIAGNOSIS — G8929 Other chronic pain: Secondary | ICD-10-CM

## 2021-05-21 DIAGNOSIS — M25562 Pain in left knee: Secondary | ICD-10-CM

## 2021-06-22 ENCOUNTER — Other Ambulatory Visit: Payer: Self-pay | Admitting: Nurse Practitioner

## 2021-06-22 DIAGNOSIS — E119 Type 2 diabetes mellitus without complications: Secondary | ICD-10-CM

## 2021-06-22 NOTE — Telephone Encounter (Signed)
Requested Prescriptions  Pending Prescriptions Disp Refills  . glimepiride (AMARYL) 2 MG tablet [Pharmacy Med Name: GLIMEPIRIDE 2 MG TABLET] 90 tablet 0    Sig: TAKE 1 TABLET BY MOUTH EVERY DAY WITH BREAKFAST     Endocrinology:  Diabetes - Sulfonylureas Passed - 06/22/2021  3:28 AM      Passed - HBA1C is between 0 and 7.9 and within 180 days    HbA1c, POC (controlled diabetic range)  Date Value Ref Range Status  02/19/2021 6.8 0.0 - 7.0 % Final         Passed - Valid encounter within last 6 months    Recent Outpatient Visits          4 months ago Type 2 diabetes mellitus without complication, without long-term current use of insulin (HCC)   [place] [facility] [place], [place] W, NP   6 months ago Essential hypertension   [place] [facility] [place], Jeannett Senior L, RPH-CPP   7 months ago Type 2 diabetes mellitus without complication, without long-term current use of insulin [facility])   [place] [facility] [place], [place] W, NP   1 year ago Type 2 diabetes mellitus without complication, without long-term current use of insulin [facility])   [place] [facility] [place], [place] W, NP   1 year ago Type 2 diabetes mellitus without complication, without long-term current use of insulin [facility])    [place] [facility] [place], Shea Stakes, NP

## 2021-07-21 ENCOUNTER — Other Ambulatory Visit: Payer: Self-pay | Admitting: Nurse Practitioner

## 2021-07-21 DIAGNOSIS — E119 Type 2 diabetes mellitus without complications: Secondary | ICD-10-CM

## 2021-07-21 DIAGNOSIS — E785 Hyperlipidemia, unspecified: Secondary | ICD-10-CM

## 2021-07-21 DIAGNOSIS — I10 Essential (primary) hypertension: Secondary | ICD-10-CM

## 2021-09-24 ENCOUNTER — Other Ambulatory Visit: Payer: Self-pay | Admitting: Nurse Practitioner

## 2021-09-24 DIAGNOSIS — E119 Type 2 diabetes mellitus without complications: Secondary | ICD-10-CM

## 2021-10-21 ENCOUNTER — Other Ambulatory Visit: Payer: Self-pay | Admitting: Nurse Practitioner

## 2021-10-21 DIAGNOSIS — E119 Type 2 diabetes mellitus without complications: Secondary | ICD-10-CM

## 2021-10-21 DIAGNOSIS — E785 Hyperlipidemia, unspecified: Secondary | ICD-10-CM

## 2021-10-21 DIAGNOSIS — I10 Essential (primary) hypertension: Secondary | ICD-10-CM

## 2021-11-13 ENCOUNTER — Other Ambulatory Visit: Payer: Self-pay | Admitting: Family Medicine

## 2021-11-13 DIAGNOSIS — E785 Hyperlipidemia, unspecified: Secondary | ICD-10-CM

## 2021-11-13 DIAGNOSIS — E119 Type 2 diabetes mellitus without complications: Secondary | ICD-10-CM

## 2021-11-13 DIAGNOSIS — I10 Essential (primary) hypertension: Secondary | ICD-10-CM

## 2021-11-13 NOTE — Telephone Encounter (Signed)
Requested medication (s) are due for refill today: yes  Requested medication (s) are on the active medication list: yes  Last refill:  10/21/21 #30  Future visit scheduled: called pt and attempted to make appt but first one available. Pt appt put on wait list. First available was March no appt made-   Notes to clinic:  needs appt   Requested Prescriptions  Pending Prescriptions Disp Refills   atorvastatin (LIPITOR) 10 MG tablet [Pharmacy Med Name: ATORVASTATIN 10 MG TABLET] 30 tablet 0    Sig: TAKE 1 TABLET BY MOUTH EVERY DAY     Cardiovascular:  Antilipid - Statins Failed - 11/13/2021 10:30 AM      Failed - HDL in normal range and within 360 days    HDL  Date Value Ref Range Status  11/20/2020 30 (L) >39 mg/dL Final          Failed - Triglycerides in normal range and within 360 days    Triglycerides  Date Value Ref Range Status  11/20/2020 188 (H) 0 - 149 mg/dL Final          Passed - Total Cholesterol in normal range and within 360 days    Cholesterol, Total  Date Value Ref Range Status  11/20/2020 141 100 - 199 mg/dL Final          Passed - LDL in normal range and within 360 days    LDL Chol Calc (NIH)  Date Value Ref Range Status  11/20/2020 79 0 - 99 mg/dL Final          Passed - Patient is not pregnant      Passed - Valid encounter within last 12 months    Recent Outpatient Visits           8 months ago Type 2 diabetes mellitus without complication, without long-term current use of insulin (HCC)   [place] [facility] [place], Shea Stakes, NP   11 months ago Essential hypertension   [place] [facility] [place], Jeannett Senior L, RPH-CPP   11 months ago Type 2 diabetes mellitus without complication, without long-term current use of insulin [facility])   [place] [facility] [place], [place] W, NP   1 year ago Type 2 diabetes mellitus without complication, without long-term current use of insulin [facility])    [place] [facility] [place], [place] W, NP   2 years ago Type 2 diabetes mellitus without complication, without long-term current use of insulin [facility])   [place] [facility] [place], Shea Stakes, NP

## 2021-11-13 NOTE — Telephone Encounter (Signed)
Requested medication (s) are due for refill today: 10/21/21 #30       met. 10/21/21 #60     lisinopril 10/21/21 #30  Requested medication (s) are on the active medication list: yes  Last refill:  10/21/21  Future visit scheduled: no  Notes to clinic:    Future visit scheduled: called pt and attempted to make appt but first one available is in March. Pt appt put on wait list.  Requested Prescriptions  Pending Prescriptions Disp Refills   glimepiride (AMARYL) 2 MG tablet [Pharmacy Med Name: GLIMEPIRIDE 2 MG TABLET] 30 tablet 0    Sig: TAKE 1 TABLET BY MOUTH [place]     Endocrinology:  Diabetes - Sulfonylureas Failed - 11/13/2021 12:33 PM      Failed - HBA1C is between 0 and 7.9 and within 180 days    HbA1c, POC (controlled diabetic range)  Date Value Ref Range Status  02/19/2021 6.8 0.0 - 7.0 % Final          Failed - Valid encounter within last 6 months    Recent Outpatient Visits           8 months ago Type 2 diabetes mellitus without complication, without long-term current use of insulin ([place])   [place] [employer], Vernia Buff, NP   11 months ago Essential hypertension   [place], Annie Main L, RPH-CPP   11 months ago Type 2 diabetes mellitus without complication, without long-term current use of insulin ([place])   [place], [place] W, NP   1 year ago Type 2 diabetes mellitus without complication, without long-term current use of insulin ([place])   [place], [place] W, NP   2 years ago Type 2 diabetes mellitus without complication, without long-term current use of insulin ([place])   [place], [place] W, NP               metFORMIN (GLUCOPHAGE) 500 MG tablet [Pharmacy Med Name: METFORMIN HCL 500 MG TABLET] 60 tablet 0    Sig: TAKE 1 TABLET BY MOUTH TWICE A DAY WITH MEALS      Endocrinology:  Diabetes - Biguanides Failed - 11/13/2021 12:33 PM      Failed - HBA1C is between 0 and 7.9 and within 180 days    HbA1c, POC (controlled diabetic range)  Date Value Ref Range Status  02/19/2021 6.8 0.0 - 7.0 % Final          Failed - Valid encounter within last 6 months    Recent Outpatient Visits           8 months ago Type 2 diabetes mellitus without complication, without long-term current use of insulin [facility])   [place], [place] W, NP   11 months ago Essential hypertension   [place], Annie Main L, RPH-CPP   11 months ago Type 2 diabetes mellitus without complication, without long-term current use of insulin [facility])   [place], [place] W, NP   1 year ago Type 2 diabetes mellitus without complication, without long-term current use of insulin [facility])   [place], [place] W, NP   2 years ago Type 2 diabetes mellitus without complication, without long-term current use of insulin ([place])   [place], [place] W, NP              Passed - Cr in normal range and within 360 days    Creatinine, Ser  Date Value Ref Range Status  02/19/2021 1.20 0.76 - 1.27 mg/dL Final          Passed - AA eGFR in normal range and within 360 days    GFR calc Af Amer  Date Value Ref Range Status  11/20/2020 86 >59 mL/min/1.73 Final    Comment:    **In accordance with recommendations from the NKF-ASN Task force,**   [facility] is in the process of updating its eGFR calculation to the   2021 CKD-EPI creatinine equation that estimates kidney function   without a race variable.    GFR calc non Af Amer  Date Value Ref Range Status  11/20/2020 74 >59 mL/min/1.73 Final   eGFR  Date Value Ref Range Status  02/19/2021 69 >59 mL/min/1.73 Final           lisinopril (ZESTRIL) 10 MG tablet [Pharmacy  Med Name: LISINOPRIL 10 MG TABLET] 30 tablet 0    Sig: TAKE 1 TABLET BY MOUTH EVERY DAY     Cardiovascular:  ACE Inhibitors Failed - 11/13/2021 12:33 PM      Failed - Cr in normal range and within 180 days    Creatinine, Ser  Date Value Ref Range Status  02/19/2021 1.20 0.76 - 1.27 mg/dL Final          Failed - K in normal range and within 180 days    Potassium  Date Value Ref Range Status  02/19/2021 4.2 3.5 - 5.2 mmol/L Final          Failed - Valid encounter within last 6 months    Recent Outpatient Visits           8 months ago Type 2 diabetes mellitus without complication, without long-term current use of insulin ([place])   [place], Vernia Buff, NP   11 months ago Essential hypertension   [place], Annie Main L, RPH-CPP   11 months ago Type 2 diabetes mellitus without complication, without long-term current use of insulin [facility])   [place], [place] W, NP   1 year ago Type 2 diabetes mellitus without complication, without long-term current use of insulin [facility])   [place], [place] W, NP   2 years ago Type 2 diabetes mellitus without complication, without long-term current use of insulin [facility])   [place], [place], [place]              Passed - Patient is not pregnant      Passed - Last BP in normal range    BP Readings from Last 1 Encounters:  02/19/21 124/79

## 2021-12-06 ENCOUNTER — Other Ambulatory Visit: Payer: Self-pay | Admitting: Family Medicine

## 2021-12-06 DIAGNOSIS — I10 Essential (primary) hypertension: Secondary | ICD-10-CM

## 2021-12-06 DIAGNOSIS — E119 Type 2 diabetes mellitus without complications: Secondary | ICD-10-CM

## 2021-12-06 DIAGNOSIS — E785 Hyperlipidemia, unspecified: Secondary | ICD-10-CM

## 2021-12-06 NOTE — Telephone Encounter (Signed)
Requested medications are due for refill today.  Yes - all 4  Requested medications are on the active medications list.  Yes - all 4  Last refill. All 4 10/21/2021 given a 30 day supply.  Future visit scheduled.   no  Notes to clinic.  Courtesy refills already given on all 4 meds. Pt last seen 02/19/2021.    Requested Prescriptions  Pending Prescriptions Disp Refills   metFORMIN (GLUCOPHAGE) 500 MG tablet [Pharmacy Med Name: METFORMIN HCL 500 MG TABLET] 60 tablet 0    Sig: TAKE 1 TABLET BY MOUTH TWICE A DAY WITH MEALS     Endocrinology:  Diabetes - Biguanides Failed - 12/06/2021 12:03 AM      Failed - HBA1C is between 0 and 7.9 and within 180 days    HbA1c, POC (controlled diabetic range)  Date Value Ref Range Status  02/19/2021 6.8 0.0 - 7.0 % Final          Failed - B12 Level in normal range and within 720 days    No results found for: VITAMINB12        Failed - Valid encounter within last 6 months    Recent Outpatient Visits           9 months ago Type 2 diabetes mellitus without complication, without long-term current use of insulin ([place])   [place], Vernia Buff, NP   11 months ago Essential hypertension   [place], Annie Main L, RPH-CPP   1 year ago Type 2 diabetes mellitus without complication, without long-term current use of insulin ([place])   [place], [place] W, NP   1 year ago Type 2 diabetes mellitus without complication, without long-term current use of insulin ([place])   [place], [place] W, NP   2 years ago Type 2 diabetes mellitus without complication, without long-term current use of insulin ([place])   [place], [place] W, NP              Failed - CBC within normal limits and completed in the last 12 months    WBC  Date Value Ref Range Status  11/20/2020 7.9 3.4 -  10.8 x10E3/uL Final  07/14/2018 15.2 (H) 4.0 - 10.5 K/uL Final   RBC  Date Value Ref Range Status  11/20/2020 5.26 4.14 - 5.80 x10E6/uL Final  07/14/2018 4.95 4.22 - 5.81 MIL/uL Final   Hemoglobin  Date Value Ref Range Status  11/20/2020 15.9 13.0 - 17.7 g/dL Final   Hematocrit  Date Value Ref Range Status  11/20/2020 45.4 37.5 - 51.0 % Final   MCHC  Date Value Ref Range Status  11/20/2020 35.0 31.5 - 35.7 g/dL Final  07/14/2018 33.8 30.0 - 36.0 g/dL Final   [facility]  Date Value Ref Range Status  11/20/2020 30.2 26.6 - 33.0 pg Final  07/14/2018 30.5 26.0 - 34.0 pg Final   MCV  Date Value Ref Range Status  11/20/2020 86 79 - 97 fL Final   No results found for: PLTCOUNTKUC, LABPLAT, POCPLA RDW  Date Value Ref Range Status  11/20/2020 12.1 11.6 - 15.4 % Final         Passed - Cr in normal range and within 360 days    Creatinine, Ser  Date Value Ref Range Status  02/19/2021 1.20 0.76 - 1.27 mg/dL Final  Passed - eGFR in normal range and within 360 days    GFR calc Af Amer  Date Value Ref Range Status  11/20/2020 86 >59 mL/min/1.73 Final    Comment:    **In accordance with recommendations from the NKF-ASN Task force,**   [facility] is in the process of updating its eGFR calculation to the   2021 CKD-EPI creatinine equation that estimates kidney function   without a race variable.    GFR calc non Af Amer  Date Value Ref Range Status  11/20/2020 74 >59 mL/min/1.73 Final   eGFR  Date Value Ref Range Status  02/19/2021 69 >59 mL/min/1.73 Final           atorvastatin (LIPITOR) 10 MG tablet [Pharmacy Med Name: ATORVASTATIN 10 MG TABLET] 30 tablet 0    Sig: TAKE 1 TABLET BY MOUTH EVERY DAY     Cardiovascular:  Antilipid - Statins Failed - 12/06/2021 12:03 AM      Failed - Lipid Panel in normal range within the last 12 months    Cholesterol, Total  Date Value Ref Range Status  11/20/2020 141 100 - 199 mg/dL Final   LDL Chol Calc (NIH)  Date Value Ref  Range Status  11/20/2020 79 0 - 99 mg/dL Final   HDL  Date Value Ref Range Status  11/20/2020 30 (L) >39 mg/dL Final   Triglycerides  Date Value Ref Range Status  11/20/2020 188 (H) 0 - 149 mg/dL Final         Passed - Patient is not pregnant      Passed - Valid encounter within last 12 months    Recent Outpatient Visits           9 months ago Type 2 diabetes mellitus without complication, without long-term current use of insulin ([place])   [place], Vernia Buff, NP   11 months ago Essential hypertension   [place], Jarome Matin, RPH-CPP   1 year ago Type 2 diabetes mellitus without complication, without long-term current use of insulin ([place])   [place], [place] W, NP   1 year ago Type 2 diabetes mellitus without complication, without long-term current use of insulin ([place])   [place], [place] W, NP   2 years ago Type 2 diabetes mellitus without complication, without long-term current use of insulin ([place])   [place], [place] W, NP               glimepiride (AMARYL) 2 MG tablet [Pharmacy Med Name: GLIMEPIRIDE 2 MG TABLET] 30 tablet 0    Sig: TAKE 1 TABLET BY MOUTH EVERY DAY WITH BREAKFAST     Endocrinology:  Diabetes - Sulfonylureas Failed - 12/06/2021 12:03 AM      Failed - HBA1C is between 0 and 7.9 and within 180 days    HbA1c, POC (controlled diabetic range)  Date Value Ref Range Status  02/19/2021 6.8 0.0 - 7.0 % Final          Failed - Valid encounter within last 6 months    Recent Outpatient Visits           9 months ago Type 2 diabetes mellitus without complication, without long-term current use of insulin [facility])   [place], Vernia Buff, NP   11 months ago Essential hypertension   [employer]   [place], RPH-CPP   1 year ago Type 2 diabetes mellitus without complication, without long-term current use of insulin ([place])   [place], [place], NP   1 year ago Type 2 diabetes mellitus without complication, without long-term current use of insulin ([place])   [place], [place] W, NP   2 years ago Type 2 diabetes mellitus without complication, without long-term current use of insulin ([place])   [place], [place] W, NP              Passed - Cr in normal range and within 360 days    Creatinine, Ser  Date Value Ref Range Status  02/19/2021 1.20 0.76 - 1.27 mg/dL Final           lisinopril (ZESTRIL) 10 MG tablet [Pharmacy Med Name: LISINOPRIL 10 MG TABLET] 30 tablet 0    Sig: TAKE 1 TABLET BY MOUTH EVERY DAY     Cardiovascular:  ACE Inhibitors Failed - 12/06/2021 12:03 AM      Failed - Cr in normal range and within 180 days    Creatinine, Ser  Date Value Ref Range Status  02/19/2021 1.20 0.76 - 1.27 mg/dL Final          Failed - K in normal range and within 180 days    Potassium  Date Value Ref Range Status  02/19/2021 4.2 3.5 - 5.2 mmol/L Final          Failed - Valid encounter within last 6 months    Recent Outpatient Visits           9 months ago Type 2 diabetes mellitus without complication, without long-term current use of insulin ([place])   [place], Vernia Buff, NP   11 months ago Essential hypertension   [place], Annie Main L, RPH-CPP   1 year ago Type 2 diabetes mellitus without complication, without long-term current use of insulin [facility])   [place], [place] W, NP   1 year ago Type 2 diabetes mellitus without complication, without long-term current use of insulin [facility])   [place], [place]  W, NP   2 years ago Type 2 diabetes mellitus without complication, without long-term current use of insulin [facility])   [place], [place], [place]              Passed - Patient is not pregnant      Passed - Last BP in normal range    BP Readings from Last 1 Encounters:  02/19/21 124/79

## 2022-01-21 ENCOUNTER — Other Ambulatory Visit: Payer: Self-pay | Admitting: Nurse Practitioner

## 2022-01-21 ENCOUNTER — Ambulatory Visit: Payer: Self-pay

## 2022-01-22 ENCOUNTER — Other Ambulatory Visit: Payer: Self-pay | Admitting: Nurse Practitioner

## 2022-01-22 DIAGNOSIS — I10 Essential (primary) hypertension: Secondary | ICD-10-CM

## 2022-01-22 DIAGNOSIS — E119 Type 2 diabetes mellitus without complications: Secondary | ICD-10-CM

## 2022-01-25 ENCOUNTER — Other Ambulatory Visit: Payer: Self-pay | Admitting: Nurse Practitioner

## 2022-01-25 DIAGNOSIS — E785 Hyperlipidemia, unspecified: Secondary | ICD-10-CM

## 2022-01-25 DIAGNOSIS — E119 Type 2 diabetes mellitus without complications: Secondary | ICD-10-CM

## 2022-02-09 ENCOUNTER — Ambulatory Visit: Payer: Self-pay | Attending: Nurse Practitioner | Admitting: Nurse Practitioner

## 2022-02-09 ENCOUNTER — Encounter: Payer: Self-pay | Admitting: Nurse Practitioner

## 2022-02-09 VITALS — BP 137/87 | HR 72 | Wt 196.6 lb

## 2022-02-09 DIAGNOSIS — I10 Essential (primary) hypertension: Secondary | ICD-10-CM

## 2022-02-09 DIAGNOSIS — G8929 Other chronic pain: Secondary | ICD-10-CM

## 2022-02-09 DIAGNOSIS — E785 Hyperlipidemia, unspecified: Secondary | ICD-10-CM

## 2022-02-09 DIAGNOSIS — E119 Type 2 diabetes mellitus without complications: Secondary | ICD-10-CM

## 2022-02-09 DIAGNOSIS — L219 Seborrheic dermatitis, unspecified: Secondary | ICD-10-CM

## 2022-02-09 DIAGNOSIS — M25562 Pain in left knee: Secondary | ICD-10-CM

## 2022-02-09 MED ORDER — ATORVASTATIN CALCIUM 10 MG PO TABS: 10.0000 mg | ORAL_TABLET | Freq: Every day | ORAL | 1 refills | Status: DC

## 2022-02-09 MED ORDER — SELENIUM SULFIDE 2.5 % EX LOTN: 1.0000 "application " | TOPICAL_LOTION | Freq: Every day | CUTANEOUS | 3 refills | Status: DC | PRN

## 2022-02-09 MED ORDER — GLIMEPIRIDE 2 MG PO TABS: ORAL_TABLET | ORAL | 1 refills | Status: DC

## 2022-02-09 MED ORDER — LISINOPRIL 10 MG PO TABS: 10.0000 mg | ORAL_TABLET | Freq: Every day | ORAL | 1 refills | Status: DC

## 2022-02-09 MED ORDER — MELOXICAM 7.5 MG PO TABS: 7.5000 mg | ORAL_TABLET | Freq: Every day | ORAL | 3 refills | Status: DC

## 2022-02-09 MED ORDER — METFORMIN HCL 500 MG PO TABS: 500.0000 mg | ORAL_TABLET | Freq: Two times a day (BID) | ORAL | 1 refills | Status: DC

## 2022-02-09 NOTE — Progress Notes (Signed)
? ?Assessment & Plan:  ?Tegh was seen today for medication refill and diabetes. ? ?Diagnoses and all orders for this visit: ? ?Primary hypertension ?-     lisinopril (ZESTRIL) 10 MG tablet; Take 1 tablet (10 mg total) by mouth daily. ?DASH DIET ? ?Hyperlipidemia, unspecified hyperlipidemia type ?-     Lipid panel ?-     atorvastatin (LIPITOR) 10 MG tablet; Take 1 tablet (10 mg total) by mouth daily. ? ?Type 2 diabetes mellitus without complication, without long-term current use of insulin  ?INCREASED GLIMEPIRIDE to 4 mg daily ?-     Hemoglobin A1c ?-     CMP14+EGFR ?-     glimepiride (AMARYL) 4 MG tablet; TAKE 1 TABLET BY MOUTH EVERY DAY WITH BREAKFAST ?-     metFORMIN (GLUCOPHAGE) 500 MG tablet; Take 1 tablet (500 mg total) by mouth 2 (two) times daily with a meal. ? ?Chronic pain of left knee ?Pain controlled.  ?-     meloxicam (MOBIC) 7.5 MG tablet; Take 1 tablet (7.5 mg total) by mouth daily. ? ?Seborrheic dermatitis ?Symptoms improved with shampoo application ?-     selenium sulfide (SELSUN) 2.5 % shampoo; Apply 1 application. topically daily as needed for irritation. ? ? ? ?Patient has been counseled on age-appropriate routine health concerns for screening and prevention. These are reviewed and up-to-date. Referrals have been placed accordingly. Immunizations are up-to-date or declined.    ?Subjective:  ? ?Chief Complaint  ?Patient presents with  ? Medication Refill  ? Diabetes  ? ?HPI ?Carl Hendricks 61 y.o. male presents to office today for follow up to DM and HTN ?He has a past medical history of DM2, Hyperlipidemia, Hypertension, and Seborrheic dermatitis.   ? ?HTN ?Well controlled with lisinopril 10 mg daily. He does not monitor his blood pressure at home.  ?BP Readings from Last 3 Encounters:  ?02/09/22 137/87  ?02/19/21 124/79  ?12/18/20 (!) 143/89  ?  ?DM ?Not well controlled. Will increase glimepiride to 4 mg daily and he will continue on metformin 500 mg BID. LDL at goal with atorvastatin 10 mg   ?Lab Results  ?Component Value Date  ? HGBA1C 8.2 (H) 02/09/2022  ?  ?Lab Results  ?Component Value Date  ? [place] 70 02/09/2022  ?  ?Review of Systems  ?Constitutional:  Negative for fever, malaise/fatigue and weight loss.  ?HENT: Negative.  Negative for nosebleeds.   ?Eyes: Negative.  Negative for blurred vision, double vision and photophobia.  ?Respiratory: Negative.  Negative for cough and shortness of breath.   ?Cardiovascular: Negative.  Negative for chest pain, palpitations and leg swelling.  ?Gastrointestinal: Negative.  Negative for heartburn, nausea and vomiting.  ?Musculoskeletal:  Positive for joint pain. Negative for myalgias.  ?Neurological: Negative.  Negative for dizziness, focal weakness, seizures and headaches.  ?Psychiatric/Behavioral: Negative.  Negative for suicidal ideas.   ? ?Past Medical History:  ?Diagnosis Date  ? Diabetes mellitus without complication ([place])   ? Hyperlipidemia   ? Hypertension   ? Seborrheic dermatitis   ? ? ?No past surgical history on file. ? ?Family History  ?Problem Relation Age of Onset  ? Diabetes Mother   ? Diabetes Father   ? Diabetes Sister   ? ? ?Social History Reviewed with no changes to be made today.  ? ?Outpatient Medications Prior to Visit  ?Medication Sig Dispense Refill  ? Accu-Chek Softclix Lancets lancets Use to check blood sugar once daily. 100 each 2  ? acetaminophen (TYLENOL) 500 MG tablet Take 2  tablets (1,000 mg total) by mouth every 6 (six) hours as needed. 30 tablet 0  ? glucose blood (TRUE METRIX BLOOD GLUCOSE TEST) test strip Use as instructed. Check blood glucose level by fingerstick once per day. E11.65 100 each 12  ? Misc. Devices MISC Please provide patient with insurance approved glucometer, strips and lancets. E11.65. Instructions: monitor blood glucose levels twice daily. Strips quantity 100 with 6 refills. Lancets quantity 100 with 6 refills. 1 each 0  ? atorvastatin (LIPITOR) 10 MG tablet TAKE 1 TABLET BY MOUTH EVERY DAY 30 tablet 0  ?  glimepiride (AMARYL) 2 MG tablet TAKE 1 TABLET BY MOUTH EVERY DAY WITH BREAKFAST 30 tablet 0  ? lisinopril (ZESTRIL) 10 MG tablet TAKE 1 TABLET BY MOUTH EVERY DAY 30 tablet 0  ? meloxicam (MOBIC) 7.5 MG tablet TAKE 1 TABLET BY MOUTH EVERY DAY 30 tablet 0  ? metFORMIN (GLUCOPHAGE) 500 MG tablet TAKE 1 TABLET BY MOUTH TWICE A DAY WITH MEALS 60 tablet 0  ? selenium sulfide (SELSUN) 2.5 % shampoo Apply 1 application topically daily as needed for irritation. 118 mL 3  ? ?No facility-administered medications prior to visit.  ? ? ?No Known Allergies ? ?   ?Objective:  ?  ?BP 137/87   Pulse 72   Wt 196 lb 9.6 oz (89.2 kg)   SpO2 95%   BMI 26.66 kg/m?  ?Wt Readings from Last 3 Encounters:  ?02/09/22 196 lb 9.6 oz (89.2 kg)  ?02/19/21 199 lb (90.3 kg)  ?11/20/20 208 lb (94.3 kg)  ? ? ?Physical Exam ?Vitals and nursing note reviewed.  ?Constitutional:   ?   Appearance: He is well-developed.  ?HENT:  ?   Head: Normocephalic and atraumatic.  ?Cardiovascular:  ?   Rate and Rhythm: Normal rate and regular rhythm.  ?   Heart sounds: Normal heart sounds. No murmur heard. ?  No friction rub. No gallop.  ?Pulmonary:  ?   Effort: Pulmonary effort is normal. No tachypnea or respiratory distress.  ?   Breath sounds: Normal breath sounds. No decreased breath sounds, wheezing, rhonchi or rales.  ?Chest:  ?   Chest wall: No tenderness.  ?Abdominal:  ?   General: Bowel sounds are normal.  ?   Palpations: Abdomen is soft.  ?Musculoskeletal:     ?   General: Normal range of motion.  ?   Cervical back: Normal range of motion.  ?Skin: ?   General: Skin is warm and dry.  ?Neurological:  ?   Mental Status: He is alert and oriented to person, place, and time.  ?   Coordination: Coordination normal.  ?Psychiatric:     ?   Behavior: Behavior normal. Behavior is cooperative.     ?   Thought Content: Thought content normal.     ?   Judgment: Judgment normal.  ? ? ? ? ?   ?Patient has been counseled extensively about nutrition and exercise as well  as the importance of adherence with medications and regular follow-up. The patient was given clear instructions to go to ER or return to medical center if symptoms don't improve, worsen or new problems develop. The patient verbalized understanding.  ? ?Follow-up: Return in about 3 months (around 05/11/2022).  ? ?Gildardo Pounds, FNP-BC ?[place] ?[place], [place] ?[phone]   ?02/10/2022, 10:20 PM ?

## 2022-02-10 ENCOUNTER — Encounter: Payer: Self-pay | Admitting: Nurse Practitioner

## 2022-02-10 LAB — CMP14+EGFR
ALT: 25 IU/L (ref 0–44)
AST: 24 IU/L (ref 0–40)
Albumin/Globulin Ratio: 1.5 (ref 1.2–2.2)
Albumin: 4.4 g/dL (ref 3.8–4.8)
Alkaline Phosphatase: 104 IU/L (ref 44–121)
BUN/Creatinine Ratio: 13 (ref 10–24)
BUN: 14 mg/dL (ref 8–27)
Bilirubin Total: 0.6 mg/dL (ref 0.0–1.2)
CO2: 25 mmol/L (ref 20–29)
Calcium: 9.2 mg/dL (ref 8.6–10.2)
Chloride: 104 mmol/L (ref 96–106)
Creatinine, Ser: 1.11 mg/dL (ref 0.76–1.27)
Globulin, Total: 3 g/dL (ref 1.5–4.5)
Glucose: 106 mg/dL — ABNORMAL HIGH (ref 70–99)
Potassium: 4.1 mmol/L (ref 3.5–5.2)
Sodium: 140 mmol/L (ref 134–144)
Total Protein: 7.4 g/dL (ref 6.0–8.5)
eGFR: 76 mL/min/{1.73_m2} (ref >59)

## 2022-02-10 LAB — LIPID PANEL
Chol/HDL Ratio: 4 ratio (ref 0.0–5.0)
Cholesterol, Total: 121 mg/dL (ref 100–199)
HDL: 30 mg/dL — ABNORMAL LOW (ref >39)
LDL Chol Calc (NIH): 70 mg/dL (ref 0–99)
Triglycerides: 116 mg/dL (ref 0–149)
VLDL Cholesterol Cal: 21 mg/dL (ref 5–40)

## 2022-02-10 LAB — HEMOGLOBIN A1C
Est. average glucose Bld gHb Est-mCnc: 189 mg/dL
Hgb A1c MFr Bld: 8.2 % — ABNORMAL HIGH (ref 4.8–5.6)

## 2022-02-10 MED ORDER — GLIMEPIRIDE 4 MG PO TABS: ORAL_TABLET | ORAL | 1 refills | Status: DC

## 2022-05-13 ENCOUNTER — Ambulatory Visit: Payer: BC Managed Care – PPO | Attending: Nurse Practitioner | Admitting: Nurse Practitioner

## 2022-05-13 ENCOUNTER — Encounter: Payer: Self-pay | Admitting: Nurse Practitioner

## 2022-05-13 VITALS — BP 133/85 | HR 92 | Wt 205.5 lb

## 2022-05-13 DIAGNOSIS — I10 Essential (primary) hypertension: Secondary | ICD-10-CM | POA: Diagnosis not present

## 2022-05-13 DIAGNOSIS — E785 Hyperlipidemia, unspecified: Secondary | ICD-10-CM

## 2022-05-13 DIAGNOSIS — Z125 Encounter for screening for malignant neoplasm of prostate: Secondary | ICD-10-CM

## 2022-05-13 DIAGNOSIS — D72829 Elevated white blood cell count, unspecified: Secondary | ICD-10-CM | POA: Diagnosis not present

## 2022-05-13 DIAGNOSIS — Z1211 Encounter for screening for malignant neoplasm of colon: Secondary | ICD-10-CM

## 2022-05-13 DIAGNOSIS — E1165 Type 2 diabetes mellitus with hyperglycemia: Secondary | ICD-10-CM | POA: Diagnosis not present

## 2022-05-13 LAB — POCT GLYCOSYLATED HEMOGLOBIN (HGB A1C): HbA1c, POC (controlled diabetic range): 8.1 % — AB (ref 0.0–7.0)

## 2022-05-13 LAB — GLUCOSE, POCT (MANUAL RESULT ENTRY): POC Glucose: 221 mg/dl — AB (ref 70–99)

## 2022-05-13 MED ORDER — DAPAGLIFLOZIN PROPANEDIOL 10 MG PO TABS: 10.0000 mg | ORAL_TABLET | Freq: Every day | ORAL | 1 refills | Status: DC

## 2022-05-13 MED ORDER — DAPAGLIFLOZIN PROPANEDIOL 5 MG PO TABS: 5.0000 mg | ORAL_TABLET | Freq: Every day | ORAL | 1 refills | Status: DC

## 2022-05-13 MED ORDER — LISINOPRIL 10 MG PO TABS: 10.0000 mg | ORAL_TABLET | Freq: Every day | ORAL | 1 refills | Status: DC

## 2022-05-13 MED ORDER — GLIMEPIRIDE 4 MG PO TABS: ORAL_TABLET | ORAL | 1 refills | Status: DC

## 2022-05-13 MED ORDER — ATORVASTATIN CALCIUM 10 MG PO TABS: 10.0000 mg | ORAL_TABLET | Freq: Every day | ORAL | 1 refills | Status: DC

## 2022-05-13 NOTE — Progress Notes (Signed)
Assessment & Plan:  [name] was seen today for diabetes and medication refill.  Diagnoses and all orders for this visit:  Primary hypertension -     CMP14+EGFR -     lisinopril (ZESTRIL) 10 MG tablet; Take 1 tablet (10 mg total) by mouth daily.  Hyperlipidemia, unspecified hyperlipidemia type -     Lipid panel -     atorvastatin (LIPITOR) 10 MG tablet; Take 1 tablet (10 mg total) by mouth daily.  Type 2 diabetes mellitus with hyperglycemia, without long-term current use of insulin (HCC) ADDED FARXIGA INCREASED GLIMEPIRIDE -     CMP14+EGFR -     Ambulatory referral to Ophthalmology -     POCT glucose (manual entry) -     POCT glycosylated hemoglobin (Hb A1C) -     glimepiride (AMARYL) 4 MG tablet; Take 2 tablets with your lunch -     dapagliflozin propanediol (FARXIGA) 5 MG TABS tablet; Take 1 tablet (5 mg total) by mouth daily before breakfast.  Leukocytosis, unspecified type -     CBC with Differential  Prostate cancer screening -     PSA     Patient has been counseled on age-appropriate routine health concerns for screening and prevention. These are reviewed and up-to-date. Referrals have been placed accordingly. Immunizations are up-to-date or declined.    Subjective:   Chief Complaint  Patient presents with   Diabetes   Medication Refill   HPI Carl Hendricks 61 y.o. male presents to office today for follow up to DM, HTN and HPL  He has a past medical history of DM2, Hyperlipidemia, Hypertension, and Seborrheic dermatitis.   Hypertension He is exercising and is adherent to low salt diet.  He does not have a blood pressure log  today.  Blood pressure is well controlled at home.  Patient denies chest pain, dyspnea, and lower extremity edema.  Cardiovascular risk factors: advanced age (older than 55 for men, 52 for women), diabetes mellitus, dyslipidemia, hypertension, and male gender. Use of agents associated with hypertension: none.  History of target organ  damage: none. BP Readings from Last 3 Encounters:  05/13/22 133/85  02/09/22 137/87  02/19/21 124/79    Hyperlipidemia Patient presents for follow up to hyperlipidemia.  He is medication compliant. He is diet compliant and denies statin intolerance including myalgias.  Lab Results  Component Value Date   CHOL 121 02/09/2022   Lab Results  Component Value Date   HDL 30 (L) 02/09/2022   Lab Results  Component Value Date   LDLCALC 70 02/09/2022   Lab Results  Component Value Date   TRIG 116 02/09/2022   Lab Results  Component Value Date   CHOLHDL 4.0 02/09/2022      Diabetes Mellitus Type II Current symptoms of hyper or hypoglycemia include  none  Current diabetic medications include: Glimepiride 4 mg daily, metformin 500 mg BID.  Eye exam current (within one year): no Weight trend: stable Prior visit with dietician: no Current monitoring regimen: office lab tests - 4 times yearly Home blood sugar records: fasting range: 120 and postprandial range: 140-160 Any episodes of hypoglycemia? no Is He on ACE inhibitor or angiotensin II receptor blocker?  Yes  Lab Results  Component Value Date   HGBA1C 8.1 (A) 05/13/2022   HGBA1C 8.2 (H) 02/09/2022   HGBA1C 6.8 02/19/2021         Review of Systems  Constitutional:  Negative for fever, malaise/fatigue and weight loss.  HENT: Negative.  Negative for nosebleeds.  Eyes: Negative.  Negative for blurred vision, double vision and photophobia.  Respiratory: Negative.  Negative for cough and shortness of breath.   Cardiovascular: Negative.  Negative for chest pain, palpitations and leg swelling.  Gastrointestinal: Negative.  Negative for heartburn, nausea and vomiting.  Musculoskeletal: Negative.  Negative for myalgias.  Neurological: Negative.  Negative for dizziness, focal weakness, seizures and headaches.  Psychiatric/Behavioral: Negative.  Negative for suicidal ideas.     Past Medical History:  Diagnosis Date    Diabetes mellitus without complication ([place])    Hyperlipidemia    Hypertension    Seborrheic dermatitis     No past surgical history on file.  Family History  Problem Relation Age of Onset   Diabetes Mother    Diabetes Father    Diabetes Sister     Social History Reviewed with no changes to be made today.   Outpatient Medications Prior to Visit  Medication Sig Dispense Refill   selenium sulfide (SELSUN) 2.5 % shampoo Apply 1 application. topically daily as needed for irritation. 118 mL 3   atorvastatin (LIPITOR) 10 MG tablet Take 1 tablet (10 mg total) by mouth daily. 90 tablet 1   glimepiride (AMARYL) 4 MG tablet TAKE 1 TABLET BY MOUTH EVERY DAY WITH BREAKFAST 90 tablet 1   lisinopril (ZESTRIL) 10 MG tablet Take 1 tablet (10 mg total) by mouth daily. 90 tablet 1   Accu-Chek Softclix Lancets lancets Use to check blood sugar once daily. (Patient not taking: Reported on 05/13/2022) 100 each 2   acetaminophen (TYLENOL) 500 MG tablet Take 2 tablets (1,000 mg total) by mouth every 6 (six) hours as needed. (Patient not taking: Reported on 05/13/2022) 30 tablet 0   glucose blood (TRUE METRIX BLOOD GLUCOSE TEST) test strip Use as instructed. Check blood glucose level by fingerstick once per day. E11.65 (Patient not taking: Reported on 05/13/2022) 100 each 12   metFORMIN (GLUCOPHAGE) 500 MG tablet Take 1 tablet (500 mg total) by mouth 2 (two) times daily with a meal. (Patient not taking: Reported on 05/13/2022) 180 tablet 1   Misc. Devices MISC Please provide patient with insurance approved glucometer, strips and lancets. E11.65. Instructions: monitor blood glucose levels twice daily. Strips quantity 100 with 6 refills. Lancets quantity 100 with 6 refills. (Patient not taking: Reported on 05/13/2022) 1 each 0   meloxicam (MOBIC) 7.5 MG tablet Take 1 tablet (7.5 mg total) by mouth daily. (Patient not taking: Reported on 05/13/2022) 30 tablet 3   No facility-administered medications prior to visit.     No Known Allergies     Objective:    BP 133/85   Pulse 92   Wt 205 lb 8 oz (93.2 kg)   SpO2 93%   BMI 27.87 kg/m  Wt Readings from Last 3 Encounters:  05/13/22 205 lb 8 oz (93.2 kg)  02/09/22 196 lb 9.6 oz (89.2 kg)  02/19/21 199 lb (90.3 kg)    Physical Exam Vitals and nursing note reviewed.  Constitutional:      Appearance: He is well-developed.  HENT:     Head: Normocephalic and atraumatic.  Cardiovascular:     Rate and Rhythm: Normal rate and regular rhythm.     Heart sounds: Normal heart sounds. No murmur heard.    No friction rub. No gallop.  Pulmonary:     Effort: Pulmonary effort is normal. No tachypnea or respiratory distress.     Breath sounds: Normal breath sounds. No decreased breath sounds, wheezing, rhonchi or rales.  Chest:  Chest wall: No tenderness.  Abdominal:     General: Bowel sounds are normal.     Palpations: Abdomen is soft.  Musculoskeletal:        General: Normal range of motion.     Cervical back: Normal range of motion.  Skin:    General: Skin is warm and dry.  Neurological:     Mental Status: He is alert and oriented to person, place, and time.     Coordination: Coordination normal.  Psychiatric:        Behavior: Behavior normal. Behavior is cooperative.        Thought Content: Thought content normal.        Judgment: Judgment normal.          Patient has been counseled extensively about nutrition and exercise as well as the importance of adherence with medications and regular follow-up. The patient was given clear instructions to go to ER or return to medical center if symptoms don't improve, worsen or new problems develop. The patient verbalized understanding.   Follow-up: Return in about 3 months (around 08/13/2022).   Gildardo Pounds, FNP-BC [facility] and [place], [place]   05/13/2022, 3:41 PM

## 2022-05-14 LAB — CBC WITH DIFFERENTIAL/PLATELET
Basophils Absolute: 0 10*3/uL (ref 0.0–0.2)
Basos: 0 %
EOS (ABSOLUTE): 0.1 10*3/uL (ref 0.0–0.4)
Eos: 2 %
Hematocrit: 43.7 % (ref 37.5–51.0)
Hemoglobin: 15.4 g/dL (ref 13.0–17.7)
Immature Grans (Abs): 0 10*3/uL (ref 0.0–0.1)
Immature Granulocytes: 0 %
Lymphocytes Absolute: 2.2 10*3/uL (ref 0.7–3.1)
Lymphs: 31 %
MCH: 30 pg (ref 26.6–33.0)
MCHC: 35.2 g/dL (ref 31.5–35.7)
MCV: 85 fL (ref 79–97)
Monocytes Absolute: 0.7 10*3/uL (ref 0.1–0.9)
Monocytes: 10 %
Neutrophils Absolute: 4 10*3/uL (ref 1.4–7.0)
Neutrophils: 57 %
Platelets: 182 10*3/uL (ref 150–450)
RBC: 5.13 x10E6/uL (ref 4.14–5.80)
RDW: 12.5 % (ref 11.6–15.4)
WBC: 7.1 10*3/uL (ref 3.4–10.8)

## 2022-05-14 LAB — CMP14+EGFR
ALT: 21 IU/L (ref 0–44)
AST: 23 IU/L (ref 0–40)
Albumin/Globulin Ratio: 1.4 (ref 1.2–2.2)
Albumin: 4.5 g/dL (ref 3.9–4.9)
Alkaline Phosphatase: 102 IU/L (ref 44–121)
BUN/Creatinine Ratio: 13 (ref 10–24)
BUN: 14 mg/dL (ref 8–27)
Bilirubin Total: 0.5 mg/dL (ref 0.0–1.2)
CO2: 24 mmol/L (ref 20–29)
Calcium: 9.4 mg/dL (ref 8.6–10.2)
Chloride: 98 mmol/L (ref 96–106)
Creatinine, Ser: 1.06 mg/dL (ref 0.76–1.27)
Globulin, Total: 3.2 g/dL (ref 1.5–4.5)
Glucose: 163 mg/dL — ABNORMAL HIGH (ref 70–99)
Potassium: 4.2 mmol/L (ref 3.5–5.2)
Sodium: 134 mmol/L (ref 134–144)
Total Protein: 7.7 g/dL (ref 6.0–8.5)
eGFR: 80 mL/min/{1.73_m2} (ref >59)

## 2022-05-14 LAB — PSA: Prostate Specific Ag, Serum: 1.1 ng/mL (ref 0.0–4.0)

## 2022-05-14 LAB — LIPID PANEL
Chol/HDL Ratio: 4.1 ratio (ref 0.0–5.0)
Cholesterol, Total: 128 mg/dL (ref 100–199)
HDL: 31 mg/dL — ABNORMAL LOW (ref >39)
LDL Chol Calc (NIH): 56 mg/dL (ref 0–99)
Triglycerides: 258 mg/dL — ABNORMAL HIGH (ref 0–149)
VLDL Cholesterol Cal: 41 mg/dL — ABNORMAL HIGH (ref 5–40)

## 2022-07-20 ENCOUNTER — Other Ambulatory Visit: Payer: Self-pay

## 2022-07-20 DIAGNOSIS — Z125 Encounter for screening for malignant neoplasm of prostate: Secondary | ICD-10-CM

## 2022-07-23 ENCOUNTER — Other Ambulatory Visit: Payer: Self-pay | Admitting: Nurse Practitioner

## 2022-07-23 DIAGNOSIS — E119 Type 2 diabetes mellitus without complications: Secondary | ICD-10-CM

## 2022-07-25 ENCOUNTER — Other Ambulatory Visit: Payer: Self-pay | Admitting: *Deleted

## 2022-07-25 DIAGNOSIS — Z125 Encounter for screening for malignant neoplasm of prostate: Secondary | ICD-10-CM

## 2022-07-25 NOTE — Progress Notes (Signed)
Patient: Carl Hendricks           Date of Birth: 27-Jan-1961           MRN: [id number] Visit Date: 07/25/2022 PCP: Gildardo Pounds, NP  Prostate Cancer Screening Date of last physical exam:  (Unknown) Date of last rectal exam:  (N/A) Have you ever had any of the following?: None Have you ever had or been told you have an allergy to latex products?: No Are you currently taking any natural prostate preparations?: No Are you currently experiencing any urinary symptoms?: No  Prostate Exam Exam not completed.  PSA Only completed.  Patient's History Patient Active Problem List   Diagnosis Date Noted   Diabetes mellitus without complication ([place]) 56/21/3086   Past Medical History:  Diagnosis Date   Diabetes mellitus without complication ([place])    Hyperlipidemia    Hypertension    Seborrheic dermatitis     Family History  Problem Relation Age of Onset   Diabetes Mother    Diabetes Father    Diabetes Sister     Social History   Occupational History   Not on file  Tobacco Use   Smoking status: Never   Smokeless tobacco: Never  Vaping Use   Vaping Use: Never used  Substance and Sexual Activity   Alcohol use: No   Drug use: No   Sexual activity: Yes

## 2022-07-25 NOTE — Telephone Encounter (Signed)
Requested Prescriptions  Pending Prescriptions Disp Refills  . metFORMIN (GLUCOPHAGE) 500 MG tablet [Pharmacy Med Name: METFORMIN HCL 500 MG TABLET] 180 tablet 1    Sig: TAKE 1 TABLET BY MOUTH 2 TIMES DAILY WITH A MEAL.     Endocrinology:  Diabetes - Biguanides Failed - 07/23/2022 11:06 AM      Failed - HBA1C is between 0 and 7.9 and within 180 days    HbA1c, POC (controlled diabetic range)  Date Value Ref Range Status  05/13/2022 8.1 (A) 0.0 - 7.0 % Final         Failed - B12 Level in normal range and within 720 days    No results found for: "VITAMINB12"       Passed - Cr in normal range and within 360 days    Creatinine, Ser  Date Value Ref Range Status  05/13/2022 1.06 0.76 - 1.27 mg/dL Final         Passed - eGFR in normal range and within 360 days    GFR calc Af Amer  Date Value Ref Range Status  11/20/2020 86 >59 mL/min/1.73 Final    Comment:    **In accordance with recommendations from the NKF-ASN Task force,**   [facility] is in the process of updating its eGFR calculation to the   2021 CKD-EPI creatinine equation that estimates kidney function   without a race variable.    GFR calc non Af Amer  Date Value Ref Range Status  11/20/2020 74 >59 mL/min/1.73 Final   eGFR  Date Value Ref Range Status  05/13/2022 80 >59 mL/min/1.73 Final         Passed - Valid encounter within last 6 months    Recent Outpatient Visits          2 months ago Primary hypertension   [place], Vernia Buff, NP   5 months ago Primary hypertension   [place], [place] W, NP   1 year ago Type 2 diabetes mellitus without complication, without long-term current use of insulin ([place])   [place], Vernia Buff, NP   1 year ago Essential hypertension   [place], Jarome Matin, RPH-CPP   1 year ago Type 2 diabetes mellitus without complication,  without long-term current use of insulin ([place])   [place], Vernia Buff, NP      Future Appointments            In 3 weeks Gildardo Pounds, NP [place] - CBC within normal limits and completed in the last 12 months    WBC  Date Value Ref Range Status  05/13/2022 7.1 3.4 - 10.8 x10E3/uL Final  07/14/2018 15.2 (H) 4.0 - 10.5 K/uL Final   RBC  Date Value Ref Range Status  05/13/2022 5.13 4.14 - 5.80 x10E6/uL Final  07/14/2018 4.95 4.22 - 5.81 MIL/uL Final   Hemoglobin  Date Value Ref Range Status  05/13/2022 15.4 13.0 - 17.7 g/dL Final   Hematocrit  Date Value Ref Range Status  05/13/2022 43.7 37.5 - 51.0 % Final   MCHC  Date Value Ref Range Status  05/13/2022 35.2 31.5 - 35.7 g/dL Final  07/14/2018 33.8 30.0 - 36.0 g/dL Final   [facility]  Date Value Ref Range Status  05/13/2022 30.0 26.6 -  33.0 pg Final  07/14/2018 30.5 26.0 - 34.0 pg Final   MCV  Date Value Ref Range Status  05/13/2022 85 79 - 97 fL Final   No results found for: "PLTCOUNTKUC", "LABPLAT", "POCPLA" RDW  Date Value Ref Range Status  05/13/2022 12.5 11.6 - 15.4 % Final

## 2022-07-26 LAB — PSA: Prostate Specific Ag, Serum: 1 ng/mL (ref 0.0–4.0)

## 2022-08-19 ENCOUNTER — Ambulatory Visit: Payer: BC Managed Care – PPO | Attending: Nurse Practitioner | Admitting: Nurse Practitioner

## 2022-08-19 ENCOUNTER — Encounter: Payer: Self-pay | Admitting: Nurse Practitioner

## 2022-08-19 VITALS — BP 137/82 | HR 88 | Temp 98.1°F | Ht 72.0 in | Wt 202.2 lb

## 2022-08-19 DIAGNOSIS — Z7984 Long term (current) use of oral hypoglycemic drugs: Secondary | ICD-10-CM

## 2022-08-19 DIAGNOSIS — E1165 Type 2 diabetes mellitus with hyperglycemia: Secondary | ICD-10-CM

## 2022-08-19 DIAGNOSIS — K219 Gastro-esophageal reflux disease without esophagitis: Secondary | ICD-10-CM

## 2022-08-19 DIAGNOSIS — I10 Essential (primary) hypertension: Secondary | ICD-10-CM | POA: Diagnosis not present

## 2022-08-19 DIAGNOSIS — Z23 Encounter for immunization: Secondary | ICD-10-CM | POA: Diagnosis not present

## 2022-08-19 LAB — POCT GLYCOSYLATED HEMOGLOBIN (HGB A1C): HbA1c, POC (controlled diabetic range): 9.1 % — AB (ref 0.0–7.0)

## 2022-08-19 MED ORDER — METFORMIN HCL 500 MG PO TABS
500.0000 mg | ORAL_TABLET | Freq: Two times a day (BID) | ORAL | 1 refills | Status: DC
Start: 2022-08-19 — End: 2022-11-25

## 2022-08-19 MED ORDER — FAMOTIDINE 40 MG PO TABS: 40.0000 mg | ORAL_TABLET | Freq: Every day | ORAL | 1 refills | Status: DC

## 2022-08-19 MED ORDER — LISINOPRIL 10 MG PO TABS
10.0000 mg | ORAL_TABLET | Freq: Every day | ORAL | 1 refills | Status: DC
Start: 2022-08-19 — End: 2022-11-04

## 2022-08-19 MED ORDER — GLIMEPIRIDE 4 MG PO TABS
ORAL_TABLET | ORAL | 1 refills | Status: DC
Start: 2022-08-19 — End: 2022-11-25

## 2022-08-19 NOTE — Progress Notes (Signed)
Assessment & Plan:  Carl Hendricks was seen today for diabetes and hypertension.  Diagnoses and all orders for this visit:  Type 2 diabetes mellitus with hyperglycemia, without long-term current use of insulin (HCC) -     glimepiride (AMARYL) 4 MG tablet; Take 2 tablets with your lunch. Please fill as a 90 day supply -     metFORMIN (GLUCOPHAGE) 500 MG tablet; Take 1 tablet (500 mg total) by mouth 2 (two) times daily with a meal. Please fill as a 90 day supply -     CMP14+EGFR -     POCT glycosylated hemoglobin (Hb A1C)  Primary hypertension -     lisinopril (ZESTRIL) 10 MG tablet; Take 1 tablet (10 mg total) by mouth daily. Please fill as a 90 day supply  Need for immunization against influenza -     Flu Vaccine QUAD 68moIM (Fluarix, Fluzone & Alfiuria Quad PF)  GERD without esophagitis -     famotidine (PEPCID) 40 MG tablet; Take 1 tablet (40 mg total) by mouth daily. FOR HEARTBURN. Please fill as a 90 day supply    Patient has been counseled on age-appropriate routine health concerns for screening and prevention. These are reviewed and up-to-date. Referrals have been placed accordingly. Immunizations are up-to-date or declined.    Subjective:   Chief Complaint  Patient presents with   Diabetes    No concerns.    Hypertension   HPI Carl Hendricks 61y.o. male presents to office today for follow up to HTN  He has a past medical history of DM2, Hyperlipidemia, Hypertension, and Seborrheic dermatitis.   HTN He is exercising and is adherent to low salt diet.  He does not have a blood pressure log  today. Blood pressure is well controlled at home.  BP Readings from Last 3 Encounters:  08/19/22 137/82  05/13/22 133/85  02/09/22 137/87     DM 2 Diabetes is not well controlled. He has not been taking metformin as prescribed and only taking 1 tablet daily instead of BID. He is taking 85mof glimepride daily. Could not tolerate farxiga which he states caused vomiting for the first  few days he took it so he stopped.  Lab Results  Component Value Date   HGBA1C 9.1 (A) 08/19/2022     GERD: Patient complains of heartburn. This has been associated with deep pressure at base of neck, heartburn, and midespigastric pain.  He denies hematemesis, melena, regurgitation of undigested food, and unexpected weight loss. Symptoms have been present for several weeks. He denies dysphagia.  He has not lost weight. He denies melena, hematochezia, hematemesis, and coffee ground emesis. Medical therapy in the past has included none.   Review of Systems  Constitutional:  Negative for fever, malaise/fatigue and weight loss.  HENT: Negative.  Negative for nosebleeds.   Eyes: Negative.  Negative for blurred vision, double vision and photophobia.  Respiratory: Negative.  Negative for cough and shortness of breath.   Cardiovascular: Negative.  Negative for chest pain, palpitations and leg swelling.  Gastrointestinal:  Positive for heartburn. Negative for nausea and vomiting.  Musculoskeletal: Negative.  Negative for myalgias.  Neurological: Negative.  Negative for dizziness, focal weakness, seizures and headaches.  Psychiatric/Behavioral: Negative.  Negative for suicidal ideas.     Past Medical History:  Diagnosis Date   Diabetes mellitus without complication ([place]   Hyperlipidemia    Hypertension    Seborrheic dermatitis     No past surgical history on file.  Family History  Problem Relation Age of Onset   Diabetes Mother    Diabetes Father    Diabetes Sister     Social History Reviewed with no changes to be made today.   Outpatient Medications Prior to Visit  Medication Sig Dispense Refill   atorvastatin (LIPITOR) 10 MG tablet Take 1 tablet (10 mg total) by mouth daily. 90 tablet 1   selenium sulfide (SELSUN) 2.5 % shampoo Apply 1 application. topically daily as needed for irritation. 118 mL 3   dapagliflozin propanediol (FARXIGA) 5 MG TABS tablet Take 1 tablet (5 mg total)  by mouth daily before breakfast. 90 tablet 1   glimepiride (AMARYL) 4 MG tablet Take 2 tablets with your lunch 180 tablet 1   lisinopril (ZESTRIL) 10 MG tablet Take 1 tablet (10 mg total) by mouth daily. 90 tablet 1   metFORMIN (GLUCOPHAGE) 500 MG tablet TAKE 1 TABLET BY MOUTH 2 TIMES DAILY WITH A MEAL. 180 tablet 1   Accu-Chek Softclix Lancets lancets Use to check blood sugar once daily. (Patient not taking: Reported on 08/19/2022) 100 each 2   glucose blood (TRUE METRIX BLOOD GLUCOSE TEST) test strip Use as instructed. Check blood glucose level by fingerstick once per day. E11.65 (Patient not taking: Reported on 08/19/2022) 100 each 12   Misc. Devices MISC Please provide patient with insurance approved glucometer, strips and lancets. E11.65. Instructions: monitor blood glucose levels twice daily. Strips quantity 100 with 6 refills. Lancets quantity 100 with 6 refills. (Patient not taking: Reported on 08/19/2022) 1 each 0   acetaminophen (TYLENOL) 500 MG tablet Take 2 tablets (1,000 mg total) by mouth every 6 (six) hours as needed. (Patient not taking: Reported on 08/19/2022) 30 tablet 0   No facility-administered medications prior to visit.    No Known Allergies     Objective:    BP 137/82   Pulse 88   Temp 98.1 F (36.7 C) (Temporal)   Ht 6' (1.829 m)   Wt 202 lb 3.2 oz (91.7 kg)   SpO2 96%   BMI 27.42 kg/m  Wt Readings from Last 3 Encounters:  08/19/22 202 lb 3.2 oz (91.7 kg)  05/13/22 205 lb 8 oz (93.2 kg)  02/09/22 196 lb 9.6 oz (89.2 kg)    Physical Exam Vitals and nursing note reviewed.  Constitutional:      Appearance: He is well-developed.  HENT:     Head: Normocephalic and atraumatic.  Cardiovascular:     Rate and Rhythm: Normal rate and regular rhythm.     Heart sounds: Normal heart sounds. No murmur heard.    No friction rub. No gallop.  Pulmonary:     Effort: Pulmonary effort is normal. No tachypnea or respiratory distress.     Breath sounds: Normal breath  sounds. No decreased breath sounds, wheezing, rhonchi or rales.  Chest:     Chest wall: No tenderness.  Abdominal:     General: Bowel sounds are normal.     Palpations: Abdomen is soft.  Musculoskeletal:        General: Normal range of motion.     Cervical back: Normal range of motion.  Skin:    General: Skin is warm and dry.  Neurological:     Mental Status: He is alert and oriented to person, place, and time.     Coordination: Coordination normal.  Psychiatric:        Behavior: Behavior normal. Behavior is cooperative.        Thought Content: Thought content normal.  Judgment: Judgment normal.          Patient has been counseled extensively about nutrition and exercise as well as the importance of adherence with medications and regular follow-up. The patient was given clear instructions to go to ER or return to medical center if symptoms don't improve, worsen or new problems develop. The patient verbalized understanding.   Follow-up: Return for 4 weeks meter check with luke. See me in 3 months.   Gildardo Pounds, FNP-BC [facility] and [place], [place]   08/19/2022, 8:22 PM

## 2022-08-20 LAB — CMP14+EGFR
ALT: 20 IU/L (ref 0–44)
AST: 18 IU/L (ref 0–40)
Albumin/Globulin Ratio: 1.4 (ref 1.2–2.2)
Albumin: 4.4 g/dL (ref 3.9–4.9)
Alkaline Phosphatase: 102 IU/L (ref 44–121)
BUN/Creatinine Ratio: 13 (ref 10–24)
BUN: 14 mg/dL (ref 8–27)
Bilirubin Total: 0.6 mg/dL (ref 0.0–1.2)
CO2: 25 mmol/L (ref 20–29)
Calcium: 9.5 mg/dL (ref 8.6–10.2)
Chloride: 99 mmol/L (ref 96–106)
Creatinine, Ser: 1.09 mg/dL (ref 0.76–1.27)
Globulin, Total: 3.2 g/dL (ref 1.5–4.5)
Glucose: 124 mg/dL — ABNORMAL HIGH (ref 70–99)
Potassium: 4.3 mmol/L (ref 3.5–5.2)
Sodium: 138 mmol/L (ref 134–144)
Total Protein: 7.6 g/dL (ref 6.0–8.5)
eGFR: 77 mL/min/1.73 (ref >59)

## 2022-08-20 LAB — FECAL OCCULT BLOOD, IMMUNOCHEMICAL: Fecal Occult Bld: NEGATIVE

## 2022-09-30 ENCOUNTER — Ambulatory Visit: Payer: BC Managed Care – PPO | Attending: Nurse Practitioner | Admitting: Pharmacist

## 2022-09-30 DIAGNOSIS — E1165 Type 2 diabetes mellitus with hyperglycemia: Secondary | ICD-10-CM | POA: Diagnosis not present

## 2022-09-30 NOTE — Progress Notes (Signed)
    S:     No chief complaint on file.  61 y.o. male who presents for diabetes evaluation, education, and management.  PMH is significant for T2DM, HTN. Patient was referred and last seen by Primary Care Provider, Bertram [place], on 08/19/2022. At last visit, pt was instructed to increase his metformin to twice daily.   Today, patient arrives in good spirits and presents without any assistance.  Patient reports Diabetes is longstanding.   Family/Social History:  -Fhx: DM -Tobacco: never smoker  -Alcohol: none reported   Current diabetes medications include: glimepiride 8mg  daily, metformin 500 mg BID  Patient reports adherence to taking all medications as prescribed.   Insurance coverage: BCBS  Patient denies hypoglycemic events.  Patient denies nocturia (nighttime urination).  Patient denies neuropathy (nerve pain). Patient denies visual changes. Patient reports self foot exams.   Patient reported dietary habits:  -Pt reports cutting out sweets for the most friend -Has reduced his portion sizes of breads, rice and potatoes  -Does snack on fruit but tried to limit   Patient-reported exercise habits:  Walking ~20 minutes daily   O:   ROS  Physical Exam  Patient brings his Accu Chek Guide  Daily glucose levels over the past 14 days: -118, 126, 90, 111, 106, 104, 90, 100, 96, 89,122, 86, 136 229 -Avg: 114 mg/dL    Lab Results  Component Value Date   HGBA1C 9.1 (A) 08/19/2022   There were no vitals filed for this visit.  Lipid Panel     Component Value Date/Time   CHOL 128 05/13/2022 1546   TRIG 258 (H) 05/13/2022 1546   HDL 31 (L) 05/13/2022 1546   CHOLHDL 4.1 05/13/2022 1546   LDLCALC 56 05/13/2022 1546    Clinical Atherosclerotic Cardiovascular Disease (ASCVD): No  The ASCVD Risk score (Arnett DK, et al., 2019) failed to calculate for the following reasons:   The valid total cholesterol range is 130 to 320 mg/dL   A/P: Diabetes longstanding  currently uncontrolled based on A1c, however, home glycemic control has improved. Patient is able to verbalize appropriate hypoglycemia management plan. Medication adherence appears appropriate. -Continued current medication regimen. -Extensively discussed pathophysiology of diabetes, recommended lifestyle interventions, dietary effects on blood sugar control.  -Counseled on s/sx of and management of hypoglycemia.  -Next A1c anticipated 10/2021.   Written patient instructions provided. Patient verbalized understanding of treatment plan.  Total time in face to face counseling 20 minutes.    Follow-up:  Pharmacist in 1 month. PCP clinic visit in Feb.    Mar, PharmD, Butch Penny, CPP Clinical Pharmacist [facility] & [facility] [phone]

## 2022-11-04 ENCOUNTER — Ambulatory Visit: Payer: BC Managed Care – PPO | Attending: Nurse Practitioner | Admitting: Pharmacist

## 2022-11-04 DIAGNOSIS — E1165 Type 2 diabetes mellitus with hyperglycemia: Secondary | ICD-10-CM | POA: Diagnosis not present

## 2022-11-04 DIAGNOSIS — I10 Essential (primary) hypertension: Secondary | ICD-10-CM | POA: Diagnosis not present

## 2022-11-04 LAB — POCT GLYCOSYLATED HEMOGLOBIN (HGB A1C): HbA1c, POC (controlled diabetic range): 8.2 % — AB (ref 0.0–7.0)

## 2022-11-04 MED ORDER — LISINOPRIL 10 MG PO TABS: 10.0000 mg | ORAL_TABLET | Freq: Every day | ORAL | 1 refills | Status: DC

## 2022-11-04 NOTE — Progress Notes (Signed)
    S:     No chief complaint on file.  62 y.o. male who presents for diabetes evaluation, education, and management.  PMH is significant for T2DM, HTN. Patient was referred and last seen by Primary Care Provider, Geryl Rankins, on 08/19/2022. At last visit, pt was instructed to increase his metformin to twice daily. I saw him on 09/30/2022 and continued his regimen as his home sugars were well controlled.   Today, patient arrives in good spirits and presents without any assistance. Patient reports Diabetes is longstanding. Does not have any complaints today.   Family/Social History:  -Fhx: DM -Tobacco: never smoker  -Alcohol: none reported   Current diabetes medications include: glimepiride 8mg  daily, metformin 500 mg BID  Patient reports adherence to taking all medications as prescribed.   Insurance coverage: BCBS  Patient denies hypoglycemic events.  Patient denies nocturia (nighttime urination).  Patient denies neuropathy (nerve pain). Patient denies visual changes. Patient reports self foot exams.   Patient reported dietary habits:  -Pt reports cutting out sweets for the most friend -Has reduced his portion sizes of breads, rice and potatoes  -Does snack on fruit but tried to limit   Patient-reported exercise habits:  Walking ~20 minutes daily   O:   ROS  Physical Exam  Patient brings his Accu Chek Guide  30 day avg: 127 14 day avg: 129 7 day avg: 109 Range since 09/30/22: 76 - 174. 1 outlier of 278 mg/dL  **Of note, his blood sugars started to really trend down a couple of weeks before his last appt with me 09/30/22.    Lab Results  Component Value Date   HGBA1C 8.2 (A) 11/04/2022   There were no vitals filed for this visit.  Lipid Panel     Component Value Date/Time   CHOL 128 05/13/2022 1546   TRIG 258 (H) 05/13/2022 1546   HDL 31 (L) 05/13/2022 1546   CHOLHDL 4.1 05/13/2022 1546   LDLCALC 56 05/13/2022 1546    Clinical Atherosclerotic  Cardiovascular Disease (ASCVD): No  The ASCVD Risk score (Arnett DK, et al., 2019) failed to calculate for the following reasons:   The valid total cholesterol range is 130 to 320 mg/dL   A/P: Diabetes longstanding currently uncontrolled based on A1c, however, home glycemic control has improved. His A1c is still above goal, but I believe this is due to [place] checking his A1c early this month. Since his home averages have only been at goal now for about 1.5 months and he needs a full 3 months for A1c reduction will hold off on changes for now. Patient is able to verbalize appropriate hypoglycemia management plan. Medication adherence appears appropriate. -Continued current medication regimen. -Extensively discussed pathophysiology of diabetes, recommended lifestyle interventions, dietary effects on blood sugar control.  -Counseled on s/sx of and management of hypoglycemia.  -Next A1c anticipated 01/2023.   Written patient instructions provided. Patient verbalized understanding of treatment plan.  Total time in face to face counseling 20 minutes.    Follow-up:  Pharmacist prn. PCP clinic visit in Feb.   Luke Van Ausdall, PharmD, [place], [place] [phone]

## 2022-11-25 ENCOUNTER — Encounter: Payer: Self-pay | Admitting: Nurse Practitioner

## 2022-11-25 ENCOUNTER — Ambulatory Visit: Payer: BC Managed Care – PPO | Attending: Nurse Practitioner | Admitting: Nurse Practitioner

## 2022-11-25 VITALS — BP 137/85 | HR 76 | Ht 72.0 in | Wt 203.6 lb

## 2022-11-25 DIAGNOSIS — E1165 Type 2 diabetes mellitus with hyperglycemia: Secondary | ICD-10-CM | POA: Diagnosis not present

## 2022-11-25 DIAGNOSIS — I10 Essential (primary) hypertension: Secondary | ICD-10-CM | POA: Diagnosis not present

## 2022-11-25 DIAGNOSIS — K219 Gastro-esophageal reflux disease without esophagitis: Secondary | ICD-10-CM | POA: Diagnosis not present

## 2022-11-25 DIAGNOSIS — E785 Hyperlipidemia, unspecified: Secondary | ICD-10-CM

## 2022-11-25 MED ORDER — ACCU-CHEK GUIDE VI STRP: ORAL_STRIP | 12 refills | Status: AC

## 2022-11-25 MED ORDER — METFORMIN HCL 500 MG PO TABS: 500.0000 mg | ORAL_TABLET | Freq: Two times a day (BID) | ORAL | 1 refills | Status: DC

## 2022-11-25 MED ORDER — ATORVASTATIN CALCIUM 10 MG PO TABS: 10.0000 mg | ORAL_TABLET | Freq: Every day | ORAL | 1 refills | Status: DC

## 2022-11-25 MED ORDER — FAMOTIDINE 40 MG PO TABS: 40.0000 mg | ORAL_TABLET | Freq: Every day | ORAL | 1 refills | Status: DC

## 2022-11-25 MED ORDER — GLIMEPIRIDE 4 MG PO TABS: ORAL_TABLET | ORAL | 1 refills | Status: DC

## 2022-11-25 MED ORDER — LISINOPRIL 10 MG PO TABS: 10.0000 mg | ORAL_TABLET | Freq: Every day | ORAL | 1 refills | Status: DC

## 2022-11-25 MED ORDER — ACCU-CHEK SOFTCLIX LANCETS MISC: 2 refills | Status: AC

## 2022-11-25 NOTE — Progress Notes (Signed)
Assessment & Plan:  Carl Hendricks was seen today for hypertension.  Diagnoses and all orders for this visit:  Primary hypertension -     lisinopril (ZESTRIL) 10 MG tablet; Take 1 tablet (10 mg total) by mouth daily. For blood pressure. Please fill as a 90 day supply Continue all antihypertensives as prescribed.  Reminded to bring in blood pressure log for follow  up appointment.  RECOMMENDATIONS: DASH/Mediterranean Diets are healthier choices for HTN.    Hyperlipidemia, unspecified hyperlipidemia type -     atorvastatin (LIPITOR) 10 MG tablet; Take 1 tablet (10 mg total) by mouth daily. For cholesterol INSTRUCTIONS: Work on a low fat, heart healthy diet and participate in regular aerobic exercise program by working out at least 150 minutes per week; 5 days a week-30 minutes per day. Avoid red meat/beef/steak,  fried foods. junk foods, sodas, sugary drinks, unhealthy snacking, alcohol and smoking.  Drink at least 80 oz of water per day and monitor your carbohydrate intake daily.    Type 2 diabetes mellitus with hyperglycemia, without long-term current use of insulin (HCC) -     metFORMIN (GLUCOPHAGE) 500 MG tablet; Take 1 tablet (500 mg total) by mouth 2 (two) times daily with a meal. For diabetes. Please fill as a 90 day supply -     glimepiride (AMARYL) 4 MG tablet; Take 2 tablets with your lunch. Please fill as a 90 day supply -     Accu-Chek Softclix Lancets lancets; Use to check blood sugar once daily. -     glucose blood (ACCU-CHEK GUIDE) test strip; Use as instructed. Check blood glucose by fingerstick once per day. E11.65 Continue blood sugar control as discussed in office today, low carbohydrate diet, and regular physical exercise as tolerated, 150 minutes per week (30 min each day, 5 days per week, or 50 min 3 days per week). Keep blood sugar logs with fasting goal of 90-130 mg/dl, post prandial (after you eat) less than 180.  For Hypoglycemia: BS <60 and Hyperglycemia BS >400; contact the  clinic ASAP. Annual eye exams and foot exams are recommended.   GERD without esophagitis -     famotidine (PEPCID) 40 MG tablet; Take 1 tablet (40 mg total) by mouth daily. FOR HEARTBURN. Please fill as a 90 day supply INSTRUCTIONS: Avoid GERD Triggers: acidic, spicy or fried foods, caffeine, coffee, sodas,  alcohol and chocolate.      Patient has been counseled on age-appropriate routine health concerns for screening and prevention. These are reviewed and up-to-date. Referrals have been placed accordingly. Immunizations are up-to-date or declined.    Subjective:   Chief Complaint  Patient presents with   Hypertension   HPI [name] 62 y.o. male presents to office today for follow up to HTN  He has a past medical history of DM2, Hyperlipidemia, Hypertension, and Seborrheic dermatitis.   HTN Blood pressure is well controlled. He is currently taking lisinopril 10 mg daily as prescribed.  BP Readings from Last 3 Encounters:  11/25/22 137/85  08/19/22 137/82  05/13/22 133/85     DM 2 Has meter today. Blood glucose levels have been trending down. He has his glucometer with him today. A1c is not due today. Will repeat in April.  7 day average 107 14 day average 105 30 day average 110 90 day average 121  Review of Systems  Constitutional:  Negative for fever, malaise/fatigue and weight loss.  HENT: Negative.  Negative for nosebleeds.   Eyes: Negative.  Negative for blurred vision,  double vision and photophobia.  Respiratory: Negative.  Negative for cough and shortness of breath.   Cardiovascular: Negative.  Negative for chest pain, palpitations and leg swelling.  Gastrointestinal: Negative.  Negative for heartburn, nausea and vomiting.  Musculoskeletal: Negative.  Negative for myalgias.  Neurological: Negative.  Negative for dizziness, focal weakness, seizures and headaches.  Psychiatric/Behavioral: Negative.  Negative for suicidal ideas.     Past Medical History:   Diagnosis Date   Diabetes mellitus without complication ([place])    Hyperlipidemia    Hypertension    Seborrheic dermatitis     History reviewed. No pertinent surgical history.  Family History  Problem Relation Age of Onset   Diabetes Mother    Diabetes Father    Diabetes Sister     Social History Reviewed with no changes to be made today.   Outpatient Medications Prior to Visit  Medication Sig Dispense Refill   Misc. Devices MISC Please provide patient with insurance approved glucometer, strips and lancets. E11.65. Instructions: monitor blood glucose levels twice daily. Strips quantity 100 with 6 refills. Lancets quantity 100 with 6 refills. 1 each 0   selenium sulfide (SELSUN) 2.5 % shampoo Apply 1 application. topically daily as needed for irritation. 118 mL 3   Accu-Chek Softclix Lancets lancets Use to check blood sugar once daily. 100 each 2   atorvastatin (LIPITOR) 10 MG tablet Take 1 tablet (10 mg total) by mouth daily. 90 tablet 1   famotidine (PEPCID) 40 MG tablet Take 1 tablet (40 mg total) by mouth daily. FOR HEARTBURN. Please fill as a 90 day supply 90 tablet 1   glimepiride (AMARYL) 4 MG tablet Take 2 tablets with your lunch. Please fill as a 90 day supply 180 tablet 1   glucose blood (TRUE METRIX BLOOD GLUCOSE TEST) test strip Use as instructed. Check blood glucose level by fingerstick once per day. E11.65 100 each 12   lisinopril (ZESTRIL) 10 MG tablet Take 1 tablet (10 mg total) by mouth daily. Please fill as a 90 day supply 90 tablet 1   metFORMIN (GLUCOPHAGE) 500 MG tablet Take 1 tablet (500 mg total) by mouth 2 (two) times daily with a meal. Please fill as a 90 day supply 180 tablet 1   No facility-administered medications prior to visit.    No Known Allergies     Objective:    BP 137/85   Pulse 76   Ht 6' (1.829 m)   Wt 203 lb 9.6 oz (92.4 kg)   SpO2 97%   BMI 27.61 kg/m  Wt Readings from Last 3 Encounters:  11/25/22 203 lb 9.6 oz (92.4 kg)  08/19/22  202 lb 3.2 oz (91.7 kg)  05/13/22 205 lb 8 oz (93.2 kg)    Physical Exam Vitals and nursing note reviewed.  Constitutional:      Appearance: He is well-developed.  HENT:     Head: Normocephalic and atraumatic.  Cardiovascular:     Rate and Rhythm: Normal rate and regular rhythm.     Heart sounds: Normal heart sounds. No murmur heard.    No friction rub. No gallop.  Pulmonary:     Effort: Pulmonary effort is normal. No tachypnea or respiratory distress.     Breath sounds: Normal breath sounds. No decreased breath sounds, wheezing, rhonchi or rales.  Chest:     Chest wall: No tenderness.  Abdominal:     General: Bowel sounds are normal.     Palpations: Abdomen is soft.  Musculoskeletal:  General: Normal range of motion.     Cervical back: Normal range of motion.  Skin:    General: Skin is warm and dry.  Neurological:     Mental Status: He is alert and oriented to person, place, and time.     Coordination: Coordination normal.  Psychiatric:        Behavior: Behavior normal. Behavior is cooperative.        Thought Content: Thought content normal.        Judgment: Judgment normal.          Patient has been counseled extensively about nutrition and exercise as well as the importance of adherence with medications and regular follow-up. The patient was given clear instructions to go to ER or return to medical center if symptoms don't improve, worsen or new problems develop. The patient verbalized understanding.   Follow-up: Return in 11 weeks (on 02/10/2023).   Gildardo Pounds, FNP-BC [facility] and [facility], [place]   11/25/2022, 4:18 PM

## 2023-02-10 ENCOUNTER — Ambulatory Visit: Payer: BC Managed Care – PPO | Admitting: Nurse Practitioner

## 2023-04-14 ENCOUNTER — Ambulatory Visit: Payer: BC Managed Care – PPO | Admitting: Nurse Practitioner

## 2023-06-30 ENCOUNTER — Ambulatory Visit: Payer: BC Managed Care – PPO | Attending: Nurse Practitioner | Admitting: Nurse Practitioner

## 2023-06-30 ENCOUNTER — Encounter: Payer: Self-pay | Admitting: Nurse Practitioner

## 2023-06-30 VITALS — BP 126/78 | HR 81 | Ht 72.0 in | Wt 197.4 lb

## 2023-06-30 DIAGNOSIS — Z7984 Long term (current) use of oral hypoglycemic drugs: Secondary | ICD-10-CM

## 2023-06-30 DIAGNOSIS — E1165 Type 2 diabetes mellitus with hyperglycemia: Secondary | ICD-10-CM | POA: Diagnosis not present

## 2023-06-30 DIAGNOSIS — L219 Seborrheic dermatitis, unspecified: Secondary | ICD-10-CM

## 2023-06-30 DIAGNOSIS — I10 Essential (primary) hypertension: Secondary | ICD-10-CM

## 2023-06-30 DIAGNOSIS — E785 Hyperlipidemia, unspecified: Secondary | ICD-10-CM | POA: Diagnosis not present

## 2023-06-30 LAB — POCT GLYCOSYLATED HEMOGLOBIN (HGB A1C): Hemoglobin A1C: 7.8 % — AB (ref 4.0–5.6)

## 2023-06-30 MED ORDER — GLIMEPIRIDE 4 MG PO TABS
ORAL_TABLET | ORAL | 1 refills | Status: DC
Start: 2023-06-30 — End: 2023-10-05

## 2023-06-30 MED ORDER — SELENIUM SULFIDE 2.5 % EX LOTN
1.0000 | TOPICAL_LOTION | Freq: Every day | CUTANEOUS | 3 refills | Status: DC | PRN
Start: 2023-06-30 — End: 2024-01-05

## 2023-06-30 MED ORDER — LISINOPRIL 10 MG PO TABS: 10.0000 mg | ORAL_TABLET | Freq: Every day | ORAL | 1 refills | Status: DC

## 2023-06-30 NOTE — Progress Notes (Unsigned)
Assessment & Plan:  Granger was seen today for medical management of chronic issues.  Diagnoses and all orders for this visit:  Primary hypertension -     lisinopril (ZESTRIL) 10 MG tablet; Take 1 tablet (10 mg total) by mouth daily. For blood pressure. Please fill as a 90 day supply -     CMP14+EGFR  Type 2 diabetes mellitus with hyperglycemia, without long-term current use of insulin (HCC) -     POCT glycosylated hemoglobin (Hb A1C) -     Urine Albumin/Creatinine with ratio (send out) [LAB689] -     glimepiride (AMARYL) 4 MG tablet; Take 2 tablets with your lunch. Please fill as a 90 day supply  Hyperlipidemia, unspecified hyperlipidemia type  Seborrheic dermatitis -     selenium sulfide (SELSUN) 2.5 % lotion; Apply 1 Application topically daily as needed for irritation.    Patient has been counseled on age-appropriate routine health concerns for screening and prevention. These are reviewed and up-to-date. Referrals have been placed accordingly. Immunizations are up-to-date or declined.    Subjective:   Chief Complaint  Patient presents with   Medical Management of Chronic Issues   HPI Carl Hendricks 62 y.o. male presents to office today for follow up to HTN and DM.  He has a past medical history of DM2, Hyperlipidemia, Hypertension, and Seborrheic dermatitis.    HTN Blood pressure is well controlled. He is currently taking lisinopril 10 mg daily as prescribed.  BP Readings from Last 3 Encounters:  06/30/23 126/78  11/25/22 137/85  08/19/22 137/82        DM 2 A1c nearing goal of <7. Down from 8.2 today to 7.8. He is taking metformin 500 mg BID and glimepiride 8 mg daily.  Lab Results  Component Value Date   HGBA1C 7.8 (A) 06/30/2023    Lab Results  Component Value Date   HGBA1C 8.2 (A) 11/04/2022   LDL at goal with atorvastatin 10 mg daily Lab Results  Component Value Date   LDLCALC 56 05/13/2022    Review of Systems  Constitutional:  Negative for fever,  malaise/fatigue and weight loss.  HENT: Negative.  Negative for nosebleeds.   Eyes: Negative.  Negative for blurred vision, double vision and photophobia.  Respiratory: Negative.  Negative for cough and shortness of breath.   Cardiovascular: Negative.  Negative for chest pain, palpitations and leg swelling.  Gastrointestinal: Negative.  Negative for heartburn, nausea and vomiting.  Musculoskeletal: Negative.  Negative for myalgias.  Neurological: Negative.  Negative for dizziness, focal weakness, seizures and headaches.  Psychiatric/Behavioral: Negative.  Negative for suicidal ideas.     Past Medical History:  Diagnosis Date   Diabetes mellitus without complication (HCC)    Hyperlipidemia    Hypertension    Seborrheic dermatitis     History reviewed. No pertinent surgical history.  Family History  Problem Relation Age of Onset   Diabetes Mother    Diabetes Father    Diabetes Sister     Social History Reviewed with no changes to be made today.   Outpatient Medications Prior to Visit  Medication Sig Dispense Refill   Accu-Chek Softclix Lancets lancets Use to check blood sugar once daily. 100 each 2   atorvastatin (LIPITOR) 10 MG tablet Take 1 tablet (10 mg total) by mouth daily. For cholesterol 90 tablet 1   famotidine (PEPCID) 40 MG tablet Take 1 tablet (40 mg total) by mouth daily. FOR HEARTBURN. Please fill as a 90 day supply 90 tablet 1  glucose blood (ACCU-CHEK GUIDE) test strip Use as instructed. Check blood glucose by fingerstick once per day. E11.65 100 each 12   metFORMIN (GLUCOPHAGE) 500 MG tablet Take 1 tablet (500 mg total) by mouth 2 (two) times daily with a meal. For diabetes. Please fill as a 90 day supply 180 tablet 1   Misc. Devices MISC Please provide patient with insurance approved glucometer, strips and lancets. E11.65. Instructions: monitor blood glucose levels twice daily. Strips quantity 100 with 6 refills. Lancets quantity 100 with 6 refills. 1 each 0    glimepiride (AMARYL) 4 MG tablet Take 2 tablets with your lunch. Please fill as a 90 day supply 180 tablet 1   lisinopril (ZESTRIL) 10 MG tablet Take 1 tablet (10 mg total) by mouth daily. For blood pressure. Please fill as a 90 day supply 90 tablet 1   selenium sulfide (SELSUN) 2.5 % shampoo Apply 1 application. topically daily as needed for irritation. 118 mL 3   No facility-administered medications prior to visit.    No Known Allergies     Objective:    BP 126/78   Pulse 81   Ht 6' (1.829 m)   Wt 197 lb 6.4 oz (89.5 kg)   SpO2 99%   BMI 26.77 kg/m  Wt Readings from Last 3 Encounters:  06/30/23 197 lb 6.4 oz (89.5 kg)  11/25/22 203 lb 9.6 oz (92.4 kg)  08/19/22 202 lb 3.2 oz (91.7 kg)    Physical Exam Vitals and nursing note reviewed.  Constitutional:      Appearance: He is well-developed.  HENT:     Head: Normocephalic and atraumatic.  Cardiovascular:     Rate and Rhythm: Normal rate and regular rhythm.     Heart sounds: Normal heart sounds. No murmur heard.    No friction rub. No gallop.  Pulmonary:     Effort: Pulmonary effort is normal. No tachypnea or respiratory distress.     Breath sounds: Normal breath sounds. No decreased breath sounds, wheezing, rhonchi or rales.  Chest:     Chest wall: No tenderness.  Abdominal:     General: Bowel sounds are normal.     Palpations: Abdomen is soft.  Musculoskeletal:        General: Normal range of motion.     Cervical back: Normal range of motion.  Skin:    General: Skin is warm and dry.  Neurological:     Mental Status: He is alert and oriented to person, place, and time.     Coordination: Coordination normal.  Psychiatric:        Behavior: Behavior normal. Behavior is cooperative.        Thought Content: Thought content normal.        Judgment: Judgment normal.          Patient has been counseled extensively about nutrition and exercise as well as the importance of adherence with medications and regular  follow-up. The patient was given clear instructions to go to ER or return to medical center if symptoms don't improve, worsen or new problems develop. The patient verbalized understanding.   Follow-up: Return in about 3 months (around 09/29/2023).   Claiborne Rigg, FNP-BC [facility] and [facility] [place], [place] [phone]   07/03/2023, 7:46 PM

## 2023-07-01 LAB — CMP14+EGFR
ALT: 30 IU/L (ref 0–44)
AST: 26 IU/L (ref 0–40)
Albumin: 4.5 g/dL (ref 3.9–4.9)
Alkaline Phosphatase: 108 IU/L (ref 44–121)
BUN/Creatinine Ratio: 12 (ref 10–24)
BUN: 14 mg/dL (ref 8–27)
Bilirubin Total: 0.6 mg/dL (ref 0.0–1.2)
CO2: 25 mmol/L (ref 20–29)
Calcium: 9.7 mg/dL (ref 8.6–10.2)
Chloride: 100 mmol/L (ref 96–106)
Creatinine, Ser: 1.13 mg/dL (ref 0.76–1.27)
Globulin, Total: 3.1 g/dL (ref 1.5–4.5)
Glucose: 93 mg/dL (ref 70–99)
Potassium: 4 mmol/L (ref 3.5–5.2)
Sodium: 137 mmol/L (ref 134–144)
Total Protein: 7.6 g/dL (ref 6.0–8.5)
eGFR: 73 mL/min/{1.73_m2} (ref >59)

## 2023-07-02 LAB — MICROALBUMIN / CREATININE URINE RATIO
Creatinine, Urine: 68.2 mg/dL
Microalb/Creat Ratio: 20 mg/g{creat} (ref 0–29)
Microalbumin, Urine: 13.8 ug/mL

## 2023-07-03 ENCOUNTER — Encounter: Payer: Self-pay | Admitting: Nurse Practitioner

## 2023-07-22 ENCOUNTER — Other Ambulatory Visit: Payer: Self-pay | Admitting: Nurse Practitioner

## 2023-07-22 DIAGNOSIS — E785 Hyperlipidemia, unspecified: Secondary | ICD-10-CM

## 2023-07-24 ENCOUNTER — Other Ambulatory Visit: Payer: Self-pay | Admitting: Nurse Practitioner

## 2023-07-24 DIAGNOSIS — I10 Essential (primary) hypertension: Secondary | ICD-10-CM

## 2023-07-24 NOTE — Telephone Encounter (Signed)
Requested medication (s) are due for refill today: yes  Requested medication (s) are on the active medication list:yes  Last refill:  11/25/22 #90 1 RF  Future visit scheduled: no  Notes to clinic:  overdue lab work   Requested Prescriptions  Pending Prescriptions Disp Refills   atorvastatin (LIPITOR) 10 MG tablet [Pharmacy Med Name: ATORVASTATIN 10 MG TABLET] 90 tablet 1    Sig: TAKE 1 TABLET BY MOUTH DAILY. FOR CHOLESTEROL     Cardiovascular:  Antilipid - Statins Failed - 07/22/2023  9:28 AM      Failed - Lipid Panel in normal range within the last 12 months    Cholesterol, Total  Date Value Ref Range Status  05/13/2022 128 100 - 199 mg/dL Final   LDL Chol Calc (NIH)  Date Value Ref Range Status  05/13/2022 56 0 - 99 mg/dL Final   HDL  Date Value Ref Range Status  05/13/2022 31 (L) >39 mg/dL Final   Triglycerides  Date Value Ref Range Status  05/13/2022 258 (H) 0 - 149 mg/dL Final         Passed - Patient is not pregnant      Passed - Valid encounter within last 12 months    Recent Outpatient Visits           3 weeks ago Primary hypertension   [place] [facility] [place], Shea Stakes, NP   8 months ago Primary hypertension   [place] [facility] [place], [place] W, NP   8 months ago Type 2 diabetes mellitus with hyperglycemia, without long-term current use of insulin [facility])   [place] [facility] [place], [place] L, RPH-CPP   9 months ago Type 2 diabetes mellitus with hyperglycemia, without long-term current use of insulin [facility])   [place] [facility] [place], [place] L, RPH-CPP   11 months ago Type 2 diabetes mellitus with hyperglycemia, without long-term current use of insulin [facility])   [place] [facility] [place], Shea Stakes, NP       Future Appointments             In 2 months Claiborne Rigg, NP [facility]

## 2023-07-25 NOTE — Telephone Encounter (Signed)
Request is too soon for refill, last refill 06/30/23 for 90 and 1 refill.  Requested Prescriptions  Pending Prescriptions Disp Refills   lisinopril (ZESTRIL) 10 MG tablet [Pharmacy Med Name: LISINOPRIL 10 MG TABLET] 90 tablet 1    Sig: TAKE 1 TABLET (10 MG TOTAL) BY MOUTH DAILY. PLEASE FILL AS A 90 DAY SUPPLY     Cardiovascular:  ACE Inhibitors Passed - 07/24/2023  2:53 PM      Passed - Cr in normal range and within 180 days    Creatinine, Ser  Date Value Ref Range Status  06/30/2023 1.13 0.76 - 1.27 mg/dL Final         Passed - K in normal range and within 180 days    Potassium  Date Value Ref Range Status  06/30/2023 4.0 3.5 - 5.2 mmol/L Final         Passed - Patient is not pregnant      Passed - Last BP in normal range    BP Readings from Last 1 Encounters:  06/30/23 126/78         Passed - Valid encounter within last 6 months    Recent Outpatient Visits           3 weeks ago Primary hypertension   [place] [facility] [place], Shea Stakes, NP   8 months ago Primary hypertension   [place] [facility] [place], [place] W, NP   8 months ago Type 2 diabetes mellitus with hyperglycemia, without long-term current use of insulin [facility])   [place] [facility] [place], [place] L, RPH-CPP   9 months ago Type 2 diabetes mellitus with hyperglycemia, without long-term current use of insulin [facility])   [facility] [facility] [place], [place] L, RPH-CPP   11 months ago Type 2 diabetes mellitus with hyperglycemia, without long-term current use of insulin [facility])    [facility] [place], Shea Stakes, NP       Future Appointments             In 2 months Claiborne Rigg, NP [facility]

## 2023-09-20 ENCOUNTER — Encounter: Payer: Self-pay | Admitting: Nurse Practitioner

## 2023-10-05 ENCOUNTER — Ambulatory Visit: Payer: BC Managed Care – PPO | Attending: Nurse Practitioner | Admitting: Nurse Practitioner

## 2023-10-05 ENCOUNTER — Encounter: Payer: Self-pay | Admitting: Nurse Practitioner

## 2023-10-05 ENCOUNTER — Other Ambulatory Visit: Payer: Self-pay

## 2023-10-05 VITALS — BP 138/83 | HR 84 | Temp 97.8°F | Resp 20 | Ht 72.0 in | Wt 202.2 lb

## 2023-10-05 DIAGNOSIS — D72829 Elevated white blood cell count, unspecified: Secondary | ICD-10-CM

## 2023-10-05 DIAGNOSIS — Z7984 Long term (current) use of oral hypoglycemic drugs: Secondary | ICD-10-CM

## 2023-10-05 DIAGNOSIS — E785 Hyperlipidemia, unspecified: Secondary | ICD-10-CM

## 2023-10-05 DIAGNOSIS — E1165 Type 2 diabetes mellitus with hyperglycemia: Secondary | ICD-10-CM | POA: Diagnosis not present

## 2023-10-05 DIAGNOSIS — I10 Essential (primary) hypertension: Secondary | ICD-10-CM

## 2023-10-05 DIAGNOSIS — Z1211 Encounter for screening for malignant neoplasm of colon: Secondary | ICD-10-CM

## 2023-10-05 LAB — POCT GLYCOSYLATED HEMOGLOBIN (HGB A1C): Hemoglobin A1C: 8 % — AB (ref 4.0–5.6)

## 2023-10-05 MED ORDER — ATORVASTATIN CALCIUM 10 MG PO TABS
10.0000 mg | ORAL_TABLET | Freq: Every day | ORAL | 1 refills | Status: DC
  Filled 2023-10-05: qty 90, 90d supply, fill #0
  Filled 2023-10-05: qty 30, 30d supply, fill #0

## 2023-10-05 MED ORDER — GLIMEPIRIDE 4 MG PO TABS
ORAL_TABLET | ORAL | 1 refills | Status: DC
  Filled 2023-10-05: qty 60, 30d supply, fill #0

## 2023-10-05 MED ORDER — LISINOPRIL 10 MG PO TABS
10.0000 mg | ORAL_TABLET | Freq: Every day | ORAL | 1 refills | Status: DC
  Filled 2023-10-05: qty 30, 30d supply, fill #0

## 2023-10-05 MED ORDER — METFORMIN HCL 500 MG PO TABS
ORAL_TABLET | ORAL | 1 refills | Status: DC
  Filled 2023-10-05: qty 270, fill #0

## 2023-10-05 MED ORDER — METFORMIN HCL 500 MG PO TABS
ORAL_TABLET | ORAL | 1 refills | Status: DC
  Filled 2023-10-05: qty 90, 30d supply, fill #0

## 2023-10-05 NOTE — Progress Notes (Signed)
Assessment & Plan:  Carl Hendricks was seen today for medical management of chronic issues.  Diagnoses and all orders for this visit:  Type 2 diabetes mellitus with hyperglycemia, without long-term current use of insulin  Dose change with metformin today to add 1 extra tablet at dinnertime -     POCT glycosylated hemoglobin (Hb A1C) -     Ambulatory referral to Ophthalmology -     glimepiride (AMARYL) 4 MG tablet; Take 2 tablets with your lunch. Please fill as a 90 day supply -     metFORMIN (GLUCOPHAGE) 500 MG tablet; Take 1 tablet with lunch and 2 tablets with dinner  Primary hypertension -     lisinopril (ZESTRIL) 10 MG tablet; Take 1 tablet (10 mg total) by mouth daily. For blood pressure. Please fill as a 90 day supply Continue all antihypertensives as prescribed.  Reminded to bring in blood pressure log for follow  up appointment.  RECOMMENDATIONS: DASH/Mediterranean Diets are healthier choices for HTN.    Leukocytosis, unspecified type -     CBC with Differential  Dyslipidemia, goal LDL below 70 -     Lipid panel -     atorvastatin (LIPITOR) 10 MG tablet; Take 1 tablet (10 mg total) by mouth daily. For cholesterol INSTRUCTIONS: Work on a low fat, heart healthy diet and participate in regular aerobic exercise program by working out at least 150 minutes per week; 5 days a week-30 minutes per day. Avoid red meat/beef/steak,  fried foods. junk foods, sodas, sugary drinks, unhealthy snacking, alcohol and smoking.  Drink at least 80 oz of water per day and monitor your carbohydrate intake daily.    Colon cancer screening -     Fecal occult blood, imunochemical([facility])      Patient has been counseled on age-appropriate routine health concerns for screening and prevention. These are reviewed and up-to-date. Referrals have been placed accordingly. Immunizations are up-to-date or declined.    Subjective:   Chief Complaint  Patient presents with   Medical Management of Chronic  Issues    Carl Hendricks 62 y.o. male presents to office today for follow up to DM and HTN.   He has a past medical history of DM2,  Hyperlipidemia, Hypertension, and Seborrheic dermatitis.   HTN Blood pressure is well-controlled today with lisinopril 10 mg daily. BP Readings from Last 3 Encounters:  10/05/23 138/83  06/30/23 126/78  11/25/22 137/85      DM 2 Diabetes is not at goal of less than 7. He does endorse dietary not adherent with eating sweets and pastries.  Currently taking metformin 500 mg twice daily and glimepiride 8 mg daily as prescribed. Lab Results  Component Value Date   HGBA1C 8.0 (A) 10/05/2023    Lab Results  Component Value Date   HGBA1C 7.8 (A) 06/30/2023  LDL currently at goal of less than 70 Lab Results  Component Value Date   LDLCALC 56 05/13/2022     Review of Systems  Constitutional:  Negative for fever, malaise/fatigue and weight loss.  HENT: Negative.  Negative for nosebleeds.   Eyes: Negative.  Negative for blurred vision, double vision and photophobia.  Respiratory: Negative.  Negative for cough and shortness of breath.   Cardiovascular: Negative.  Negative for chest pain, palpitations and leg swelling.  Gastrointestinal: Negative.  Negative for heartburn, nausea and vomiting.  Musculoskeletal: Negative.  Negative for myalgias.  Neurological: Negative.  Negative for dizziness, focal weakness, seizures and headaches.  Psychiatric/Behavioral: Negative.  Negative for suicidal ideas.  Past Medical History:  Diagnosis Date   Diabetes mellitus without complication (HCC)    Hyperlipidemia    Hypertension    Seborrheic dermatitis     History reviewed. No pertinent surgical history.  Family History  Problem Relation Age of Onset   Diabetes Mother    Diabetes Father    Diabetes Sister     Social History Reviewed with no changes to be made today.   Outpatient Medications Prior to Visit  Medication Sig Dispense Refill   Misc.  Devices MISC Please provide patient with insurance approved glucometer, strips and lancets. E11.65. Instructions: monitor blood glucose levels twice daily. Strips quantity 100 with 6 refills. Lancets quantity 100 with 6 refills. 1 each 0   selenium sulfide (SELSUN) 2.5 % lotion Apply 1 Application topically daily as needed for irritation. 118 mL 3   atorvastatin (LIPITOR) 10 MG tablet TAKE 1 TABLET BY MOUTH DAILY. FOR CHOLESTEROL 90 tablet 0   glimepiride (AMARYL) 4 MG tablet Take 2 tablets with your lunch. Please fill as a 90 day supply 180 tablet 1   lisinopril (ZESTRIL) 10 MG tablet Take 1 tablet (10 mg total) by mouth daily. For blood pressure. Please fill as a 90 day supply 90 tablet 1   metFORMIN (GLUCOPHAGE) 500 MG tablet Take 1 tablet (500 mg total) by mouth 2 (two) times daily with a meal. For diabetes. Please fill as a 90 day supply 180 tablet 1   Accu-Chek Softclix Lancets lancets Use to check blood sugar once daily. (Patient not taking: Reported on 10/05/2023) 100 each 2   glucose blood (ACCU-CHEK GUIDE) test strip Use as instructed. Check blood glucose by fingerstick once per day. E11.65 (Patient not taking: Reported on 10/05/2023) 100 each 12   famotidine (PEPCID) 40 MG tablet Take 1 tablet (40 mg total) by mouth daily. FOR HEARTBURN. Please fill as a 90 day supply (Patient not taking: Reported on 10/05/2023) 90 tablet 1   No facility-administered medications prior to visit.    No Known Allergies     Objective:    BP 138/83   Pulse 84   Temp 97.8 F (36.6 C) (Oral)   Resp 20   Ht 6' (1.829 m)   Wt 202 lb 3.2 oz (91.7 kg)   SpO2 98%   BMI 27.42 kg/m  Wt Readings from Last 3 Encounters:  10/05/23 202 lb 3.2 oz (91.7 kg)  06/30/23 197 lb 6.4 oz (89.5 kg)  11/25/22 203 lb 9.6 oz (92.4 kg)    Physical Exam Vitals and nursing note reviewed.  Constitutional:      Appearance: He is well-developed.  HENT:     Head: Normocephalic and atraumatic.  Cardiovascular:     Rate  and Rhythm: Normal rate and regular rhythm.     Heart sounds: Normal heart sounds. No murmur heard.    No friction rub. No gallop.  Pulmonary:     Effort: Pulmonary effort is normal. No tachypnea or respiratory distress.     Breath sounds: Normal breath sounds. No decreased breath sounds, wheezing, rhonchi or rales.  Chest:     Chest wall: No tenderness.  Abdominal:     General: Bowel sounds are normal.     Palpations: Abdomen is soft.  Musculoskeletal:        General: Normal range of motion.     Cervical back: Normal range of motion.  Skin:    General: Skin is warm and dry.  Neurological:     Mental Status: He  is alert and oriented to person, place, and time.     Coordination: Coordination normal.  Psychiatric:        Behavior: Behavior normal. Behavior is cooperative.        Thought Content: Thought content normal.        Judgment: Judgment normal.          Patient has been counseled extensively about nutrition and exercise as well as the importance of adherence with medications and regular follow-up. The patient was given clear instructions to go to ER or return to medical center if symptoms don't improve, worsen or new problems develop. The patient verbalized understanding.   Follow-up: Return in about 3 months (around 01/03/2024).   Claiborne Rigg, FNP-BC [facility] and [facility]ness [place], [place] [phone]   10/05/2023, 4:57 PM

## 2023-10-05 NOTE — Patient Instructions (Signed)
[facility]   [address] [place] , [place] [address] Ph# [phone]

## 2023-10-06 ENCOUNTER — Ambulatory Visit: Payer: BC Managed Care – PPO | Admitting: Nurse Practitioner

## 2023-10-06 LAB — LIPID PANEL
Chol/HDL Ratio: 3.7 {ratio} (ref 0.0–5.0)
Cholesterol, Total: 114 mg/dL (ref 100–199)
HDL: 31 mg/dL — ABNORMAL LOW (ref >39)
LDL Chol Calc (NIH): 57 mg/dL (ref 0–99)
Triglycerides: 148 mg/dL (ref 0–149)
VLDL Cholesterol Cal: 26 mg/dL (ref 5–40)

## 2023-10-06 LAB — CBC WITH DIFFERENTIAL/PLATELET
Basophils Absolute: 0 10*3/uL (ref 0.0–0.2)
Basos: 1 %
EOS (ABSOLUTE): 0.1 10*3/uL (ref 0.0–0.4)
Eos: 2 %
Hematocrit: 47.7 % (ref 37.5–51.0)
Hemoglobin: 16.5 g/dL (ref 13.0–17.7)
Immature Grans (Abs): 0.1 10*3/uL (ref 0.0–0.1)
Immature Granulocytes: 1 %
Lymphocytes Absolute: 2.8 10*3/uL (ref 0.7–3.1)
Lymphs: 33 %
MCH: 30.7 pg (ref 26.6–33.0)
MCHC: 34.6 g/dL (ref 31.5–35.7)
MCV: 89 fL (ref 79–97)
Monocytes Absolute: 0.8 10*3/uL (ref 0.1–0.9)
Monocytes: 10 %
Neutrophils Absolute: 4.7 10*3/uL (ref 1.4–7.0)
Neutrophils: 53 %
Platelets: 186 10*3/uL (ref 150–450)
RBC: 5.38 x10E6/uL (ref 4.14–5.80)
RDW: 12.7 % (ref 11.6–15.4)
WBC: 8.6 10*3/uL (ref 3.4–10.8)

## 2023-10-16 ENCOUNTER — Other Ambulatory Visit: Payer: Self-pay

## 2024-01-05 ENCOUNTER — Encounter: Payer: Self-pay | Admitting: Nurse Practitioner

## 2024-01-05 ENCOUNTER — Ambulatory Visit: Payer: BC Managed Care – PPO | Attending: Nurse Practitioner | Admitting: Nurse Practitioner

## 2024-01-05 VITALS — BP 118/73 | HR 74 | Ht 72.0 in | Wt 198.0 lb

## 2024-01-05 DIAGNOSIS — E1165 Type 2 diabetes mellitus with hyperglycemia: Secondary | ICD-10-CM | POA: Diagnosis not present

## 2024-01-05 DIAGNOSIS — E785 Hyperlipidemia, unspecified: Secondary | ICD-10-CM

## 2024-01-05 DIAGNOSIS — I10 Essential (primary) hypertension: Secondary | ICD-10-CM

## 2024-01-05 DIAGNOSIS — Z7984 Long term (current) use of oral hypoglycemic drugs: Secondary | ICD-10-CM | POA: Diagnosis not present

## 2024-01-05 DIAGNOSIS — L219 Seborrheic dermatitis, unspecified: Secondary | ICD-10-CM | POA: Diagnosis not present

## 2024-01-05 LAB — POCT GLYCOSYLATED HEMOGLOBIN (HGB A1C): Hemoglobin A1C: 8.8 % — AB (ref 4.0–5.6)

## 2024-01-05 MED ORDER — LISINOPRIL 10 MG PO TABS: 10.0000 mg | ORAL_TABLET | Freq: Every day | ORAL | 1 refills | Status: AC

## 2024-01-05 MED ORDER — METFORMIN HCL 500 MG PO TABS: ORAL_TABLET | ORAL | 1 refills | Status: DC

## 2024-01-05 MED ORDER — GLIMEPIRIDE 4 MG PO TABS: ORAL_TABLET | ORAL | 1 refills | Status: AC

## 2024-01-05 MED ORDER — SELENIUM SULFIDE 2.5 % EX LOTN: 1.0000 | TOPICAL_LOTION | Freq: Every day | CUTANEOUS | 3 refills | Status: DC | PRN

## 2024-01-05 MED ORDER — ATORVASTATIN CALCIUM 10 MG PO TABS: 10.0000 mg | ORAL_TABLET | Freq: Every day | ORAL | 1 refills | Status: DC

## 2024-01-05 NOTE — Progress Notes (Signed)
 Assessment & Plan:  Carl Hendricks was seen today for medical management of chronic issues.  Diagnoses and all orders for this visit:  Type 2 diabetes mellitus with hyperglycemia, without long-term current use of insulin  Continues to decline insulin despite a lengthy conversation regarding potential complications of diabetes -     POCT glycosylated hemoglobin (Hb A1C) -     CMP14+EGFR -     metFORMIN (GLUCOPHAGE) 500 MG tablet; Take 1 tablet with lunch and 2 tablets with dinner -     glimepiride (AMARYL) 4 MG tablet; Take 2 tablets with your lunch. Please fill as a 90 day supply  Seborrheic dermatitis -     selenium sulfide (SELSUN) 2.5 % lotion; Apply 1 Application topically daily as needed for irritation.  Primary hypertension -     lisinopril (ZESTRIL) 10 MG tablet; Take 1 tablet (10 mg total) by mouth daily. For blood pressure. Please fill as a 90 day supply Continue all antihypertensives as prescribed.  Reminded to bring in blood pressure log for follow  up appointment.  RECOMMENDATIONS: DASH/Mediterranean Diets are healthier choices for HTN.    Dyslipidemia, goal LDL below 70 -     atorvastatin (LIPITOR) 10 MG tablet; Take 1 tablet (10 mg total) by mouth daily. For cholesterol INSTRUCTIONS: Work on a low fat, heart healthy diet and participate in regular aerobic exercise program by working out at least 150 minutes per week; 5 days a week-30 minutes per day. Avoid red meat/beef/steak,  fried foods. junk foods, sodas, sugary drinks, unhealthy snacking, alcohol and smoking.  Drink at least 80 oz of water per day and monitor your carbohydrate intake daily.      Patient has been counseled on age-appropriate routine health concerns for screening and prevention. These are reviewed and up-to-date. Referrals have been placed accordingly. Immunizations are up-to-date or declined.    Subjective:   Chief Complaint  Patient presents with   Medical Management of Chronic Issues    Carl  Hendricks 63 y.o. male presents to office today for follow up to DM   He has a past medical history of DM2,  Hyperlipidemia, Hypertension, and Seborrheic dermatitis.   DM 2 Diabetes is poorly controlled. He continues to refuse insulin. States "insulin is trouble". Currently prescribed metformin 500 mg BID and glimepiride 8mg  daily and endorses adherence taking both. He could not tolerate farxiga and states it caused vomiting for several days.  Lab Results  Component Value Date   HGBA1C 8.8 (A) 01/05/2024     HTN Blood pressure is well controlled with lisinopril 10 mg daily BP Readings from Last 3 Encounters:  01/05/24 118/73  10/05/23 138/83  06/30/23 126/78    Review of Systems  Constitutional:  Negative for fever, malaise/fatigue and weight loss.  HENT: Negative.  Negative for nosebleeds.   Eyes: Negative.  Negative for blurred vision, double vision and photophobia.  Respiratory: Negative.  Negative for cough and shortness of breath.   Cardiovascular: Negative.  Negative for chest pain, palpitations and leg swelling.  Gastrointestinal: Negative.  Negative for heartburn, nausea and vomiting.  Musculoskeletal: Negative.  Negative for myalgias.  Neurological: Negative.  Negative for dizziness, focal weakness, seizures and headaches.  Psychiatric/Behavioral: Negative.  Negative for suicidal ideas.     Past Medical History:  Diagnosis Date   Diabetes mellitus without complication (HCC)    Hyperlipidemia    Hypertension    Seborrheic dermatitis     History reviewed. No pertinent surgical history.  Family History  Problem  Relation Age of Onset   Diabetes Mother    Diabetes Father    Diabetes Sister     Social History Reviewed with no changes to be made today.   Outpatient Medications Prior to Visit  Medication Sig Dispense Refill   atorvastatin (LIPITOR) 10 MG tablet Take 1 tablet (10 mg total) by mouth daily. For cholesterol 90 tablet 1   glimepiride (AMARYL) 4 MG tablet  Take 2 tablets with your lunch. Please fill as a 90 day supply 180 tablet 1   lisinopril (ZESTRIL) 10 MG tablet Take 1 tablet (10 mg total) by mouth daily. For blood pressure. Please fill as a 90 day supply 90 tablet 1   metFORMIN (GLUCOPHAGE) 500 MG tablet Take 1 tablet with lunch and 2 tablets with dinner 270 tablet 1   selenium sulfide (SELSUN) 2.5 % lotion Apply 1 Application topically daily as needed for irritation. 118 mL 3   Accu-Chek Softclix Lancets lancets Use to check blood sugar once daily. (Patient not taking: Reported on 01/05/2024) 100 each 2   glucose blood (ACCU-CHEK GUIDE) test strip Use as instructed. Check blood glucose by fingerstick once per day. E11.65 (Patient not taking: Reported on 01/05/2024) 100 each 12   Misc. Devices MISC Please provide patient with insurance approved glucometer, strips and lancets. E11.65. Instructions: monitor blood glucose levels twice daily. Strips quantity 100 with 6 refills. Lancets quantity 100 with 6 refills. (Patient not taking: Reported on 01/05/2024) 1 each 0   No facility-administered medications prior to visit.    No Known Allergies     Objective:    BP 118/73 (BP Location: Left Arm, Patient Position: Sitting, Cuff Size: Normal)   Pulse 74   Ht 6' (1.829 m)   Wt 198 lb (89.8 kg)   SpO2 98%   BMI 26.85 kg/m  Wt Readings from Last 3 Encounters:  01/05/24 198 lb (89.8 kg)  10/05/23 202 lb 3.2 oz (91.7 kg)  06/30/23 197 lb 6.4 oz (89.5 kg)    Physical Exam Vitals and nursing note reviewed.  Constitutional:      Appearance: He is well-developed.  HENT:     Head: Normocephalic and atraumatic.  Cardiovascular:     Rate and Rhythm: Normal rate and regular rhythm.     Heart sounds: Normal heart sounds. No murmur heard.    No friction rub. No gallop.  Pulmonary:     Effort: Pulmonary effort is normal. No tachypnea or respiratory distress.     Breath sounds: Normal breath sounds. No decreased breath sounds, wheezing, rhonchi or  rales.  Chest:     Chest wall: No tenderness.  Abdominal:     General: Bowel sounds are normal.     Palpations: Abdomen is soft.  Musculoskeletal:        General: Normal range of motion.     Cervical back: Normal range of motion.  Skin:    General: Skin is warm and dry.  Neurological:     Mental Status: He is alert and oriented to person, place, and time.     Coordination: Coordination normal.  Psychiatric:        Behavior: Behavior normal. Behavior is cooperative.        Thought Content: Thought content normal.        Judgment: Judgment normal.          Patient has been counseled extensively about nutrition and exercise as well as the importance of adherence with medications and regular follow-up. The patient was  given clear instructions to go to ER or return to medical center if symptoms don't improve, worsen or new problems develop. The patient verbalized understanding.   Follow-up: Return in about 3 months (around 04/06/2024).   Claiborne Rigg, FNP-BC [facility] [place], [place] [phone]   01/05/2024, 4:06 PM

## 2024-01-06 ENCOUNTER — Encounter: Payer: Self-pay | Admitting: Nurse Practitioner

## 2024-01-06 LAB — CMP14+EGFR
ALT: 28 IU/L (ref 0–44)
AST: 23 IU/L (ref 0–40)
Albumin: 4.5 g/dL (ref 3.9–4.9)
Alkaline Phosphatase: 109 IU/L (ref 44–121)
BUN/Creatinine Ratio: 11 (ref 10–24)
BUN: 12 mg/dL (ref 8–27)
Bilirubin Total: 0.8 mg/dL (ref 0.0–1.2)
CO2: 26 mmol/L (ref 20–29)
Calcium: 9.2 mg/dL (ref 8.6–10.2)
Chloride: 103 mmol/L (ref 96–106)
Creatinine, Ser: 1.06 mg/dL (ref 0.76–1.27)
Globulin, Total: 2.9 g/dL (ref 1.5–4.5)
Glucose: 96 mg/dL (ref 70–99)
Potassium: 4.4 mmol/L (ref 3.5–5.2)
Sodium: 141 mmol/L (ref 134–144)
Total Protein: 7.4 g/dL (ref 6.0–8.5)
eGFR: 79 mL/min/{1.73_m2} (ref >59)

## 2024-04-05 ENCOUNTER — Ambulatory Visit: Admitting: Nurse Practitioner

## 2024-05-09 ENCOUNTER — Telehealth: Payer: Self-pay | Admitting: Family Medicine

## 2024-05-09 NOTE — Telephone Encounter (Signed)
 Called pt to confirm appt. Pt will be present.

## 2024-05-10 ENCOUNTER — Ambulatory Visit: Admitting: Nurse Practitioner

## 2024-06-26 ENCOUNTER — Telehealth: Payer: Self-pay | Admitting: Nurse Practitioner

## 2024-06-26 NOTE — Telephone Encounter (Signed)
 Pt confirmed appt 9/3

## 2024-06-28 ENCOUNTER — Ambulatory Visit: Attending: Nurse Practitioner | Admitting: Nurse Practitioner

## 2024-06-28 ENCOUNTER — Encounter: Payer: Self-pay | Admitting: Nurse Practitioner

## 2024-06-28 VITALS — BP 150/85 | HR 77 | Resp 19 | Ht 72.0 in | Wt 196.0 lb

## 2024-06-28 DIAGNOSIS — L219 Seborrheic dermatitis, unspecified: Secondary | ICD-10-CM

## 2024-06-28 DIAGNOSIS — Z7984 Long term (current) use of oral hypoglycemic drugs: Secondary | ICD-10-CM

## 2024-06-28 DIAGNOSIS — E1165 Type 2 diabetes mellitus with hyperglycemia: Secondary | ICD-10-CM | POA: Diagnosis not present

## 2024-06-28 DIAGNOSIS — Z23 Encounter for immunization: Secondary | ICD-10-CM

## 2024-06-28 LAB — POCT GLYCOSYLATED HEMOGLOBIN (HGB A1C): Hemoglobin A1C: 7.5 % — AB (ref 4.0–5.6)

## 2024-06-28 MED ORDER — SELENIUM SULFIDE 2.5 % EX LOTN: 1.0000 | TOPICAL_LOTION | Freq: Every day | CUTANEOUS | 3 refills | Status: DC | PRN

## 2024-06-28 MED ORDER — KETOCONAZOLE 2 % EX SHAM
1.0000 | MEDICATED_SHAMPOO | CUTANEOUS | 3 refills | Status: DC
Start: 2024-06-28 — End: 2024-06-28

## 2024-06-28 MED ORDER — KETOCONAZOLE 2 % EX SHAM: 1.0000 | MEDICATED_SHAMPOO | CUTANEOUS | 3 refills | Status: AC

## 2024-06-28 MED ORDER — CLOBETASOL PROPIONATE 0.05 % EX LOTN: TOPICAL_LOTION | CUTANEOUS | 0 refills | Status: AC

## 2024-06-28 NOTE — Progress Notes (Signed)
 Assessment & Plan:  Carl Hendricks was seen today for diabetes.  Diagnoses and all orders for this visit:  Type 2 diabetes mellitus with hyperglycemia, without long-term current use of insulin  A1c improved.  If still above 7.5 at next office visit will increase metformin  to 2 tablets at lunch instead of 1 tablet. -     POCT glycosylated hemoglobin (Hb A1C) -     Urine Albumin/Creatinine with ratio (send out) [LAB689] -     CMP14+EGFR  Seborrheic dermatitis -     Clobetasol  Propionate 0.05 % lotion; Apply 2 times weekly to scalp and dry areas -     ketoconazole  (NIZORAL ) 2 % shampoo; Apply 1 Application topically 2 (two) times a week.  Need for influenza vaccination -     Flu vaccine trivalent PF, 6mos and older(Flulaval,Afluria,Fluarix,Fluzone)    Patient has been counseled on age-appropriate routine health concerns for screening and prevention. These are reviewed and up-to-date. Referrals have been placed accordingly. Immunizations are up-to-date or declined.    Subjective:   Chief Complaint  Patient presents with  . Diabetes    Carl Hendricks 63 y.o. male presents to office today for follow up to DM  He has a past medical history of DM2, Hyperlipidemia, Hypertension, and Seborrheic dermatitis.   DM 2 A1c improved and down from 8.8 to currently 7.5.  He is currently taking metformin  500 mg with lunch and 1000 mg with dinner and glimepiride  8 mg daily. Lab Results  Component Value Date   HGBA1C 7.5 (A) 06/28/2024    Seborrheic dermatitis He has a moist dermatitis of the scalp.   Selenium  sulfide has been ineffective.  He declines referral to dermatology   Review of Systems  Constitutional:  Negative for fever, malaise/fatigue and weight loss.  HENT: Negative.  Negative for nosebleeds.   Eyes: Negative.  Negative for blurred vision, double vision and photophobia.  Respiratory: Negative.  Negative for cough and shortness of breath.   Cardiovascular: Negative.  Negative for  chest pain, palpitations and leg swelling.  Gastrointestinal: Negative.  Negative for heartburn, nausea and vomiting.  Musculoskeletal: Negative.  Negative for myalgias.  Skin:  Positive for itching and rash.  Neurological: Negative.  Negative for dizziness, focal weakness, seizures and headaches.  Psychiatric/Behavioral: Negative.  Negative for suicidal ideas.     Past Medical History:  Diagnosis Date  . Diabetes mellitus without complication (HCC)   . Hyperlipidemia   . Hypertension   . Seborrheic dermatitis     History reviewed. No pertinent surgical history.  Family History  Problem Relation Age of Onset  . Diabetes Mother   . Diabetes Father   . Diabetes Sister     Social History Reviewed with no changes to be made today.   Outpatient Medications Prior to Visit  Medication Sig Dispense Refill  . atorvastatin  (LIPITOR) 10 MG tablet Take 1 tablet (10 mg total) by mouth daily. For cholesterol 90 tablet 1  . glimepiride  (AMARYL ) 4 MG tablet Take 2 tablets with your lunch. Please fill as a 90 day supply 180 tablet 1  . lisinopril  (ZESTRIL ) 10 MG tablet Take 1 tablet (10 mg total) by mouth daily. For blood pressure. Please fill as a 90 day supply 90 tablet 1  . metFORMIN  (GLUCOPHAGE ) 500 MG tablet Take 1 tablet with lunch and 2 tablets with dinner 270 tablet 1  . selenium  sulfide (SELSUN ) 2.5 % lotion Apply 1 Application topically daily as needed for irritation. 118 mL 3  . Accu-Chek  Softclix Lancets lancets Use to check blood sugar once daily. (Patient not taking: Reported on 06/28/2024) 100 each 2  . glucose blood (ACCU-CHEK GUIDE) test strip Use as instructed. Check blood glucose by fingerstick once per day. E11.65 (Patient not taking: Reported on 06/28/2024) 100 each 12  . Misc. Devices MISC Please provide patient with insurance approved glucometer, strips and lancets. E11.65. Instructions: monitor blood glucose levels twice daily. Strips quantity 100 with 6 refills. Lancets  quantity 100 with 6 refills. (Patient not taking: Reported on 06/28/2024) 1 each 0   No facility-administered medications prior to visit.    No Known Allergies     Objective:    BP (!) 150/85 (BP Location: Left Arm, Patient Position: Sitting, Cuff Size: Normal)   Pulse 77   Resp 19   Ht 6' (1.829 m)   Wt 196 lb (88.9 kg)   SpO2 100%   BMI 26.58 kg/m  Wt Readings from Last 3 Encounters:  06/28/24 196 lb (88.9 kg)  01/05/24 198 lb (89.8 kg)  10/05/23 202 lb 3.2 oz (91.7 kg)    Physical Exam Vitals and nursing note reviewed.  Constitutional:      Appearance: He is well-developed.  HENT:     Head: Normocephalic and atraumatic.  Cardiovascular:     Rate and Rhythm: Normal rate and regular rhythm.     Heart sounds: Normal heart sounds. No murmur heard.    No friction rub. No gallop.  Pulmonary:     Effort: Pulmonary effort is normal. No tachypnea or respiratory distress.     Breath sounds: Normal breath sounds. No decreased breath sounds, wheezing, rhonchi or rales.  Chest:     Chest wall: No tenderness.  Musculoskeletal:        General: Normal range of motion.     Cervical back: Normal range of motion.  Skin:    General: Skin is warm and dry.     Findings: Rash present. Rash is crusting.      Neurological:     Mental Status: He is alert and oriented to person, place, and time.     Coordination: Coordination normal.  Psychiatric:        Behavior: Behavior normal. Behavior is cooperative.        Thought Content: Thought content normal.        Judgment: Judgment normal.          Patient has been counseled extensively about nutrition and exercise as well as the importance of adherence with medications and regular follow-up. The patient was given clear instructions to go to ER or return to medical center if symptoms don't improve, worsen or new problems develop. The patient verbalized understanding.   Follow-up: Return in about 3 months (around 09/30/2024).   Carl LELON Servant, FNP-BC [facility] [place], [place] [phone]   06/28/2024, 4:21 PM

## 2024-06-30 ENCOUNTER — Ambulatory Visit: Payer: Self-pay | Admitting: Nurse Practitioner

## 2024-06-30 LAB — CMP14+EGFR
ALT: 29 IU/L (ref 0–44)
AST: 28 IU/L (ref 0–40)
Albumin: 4.5 g/dL (ref 3.9–4.9)
Alkaline Phosphatase: 102 IU/L (ref 44–121)
BUN/Creatinine Ratio: 13 (ref 10–24)
BUN: 15 mg/dL (ref 8–27)
Bilirubin Total: 0.5 mg/dL (ref 0.0–1.2)
CO2: 25 mmol/L (ref 20–29)
Calcium: 9.5 mg/dL (ref 8.6–10.2)
Chloride: 99 mmol/L (ref 96–106)
Creatinine, Ser: 1.17 mg/dL (ref 0.76–1.27)
Globulin, Total: 2.9 g/dL (ref 1.5–4.5)
Glucose: 91 mg/dL (ref 70–99)
Potassium: 4.3 mmol/L (ref 3.5–5.2)
Sodium: 137 mmol/L (ref 134–144)
Total Protein: 7.4 g/dL (ref 6.0–8.5)
eGFR: 70 mL/min/1.73 (ref >59)

## 2024-06-30 LAB — MICROALBUMIN / CREATININE URINE RATIO
Creatinine, Urine: 65.2 mg/dL
Microalb/Creat Ratio: 11 mg/g{creat} (ref 0–29)
Microalbumin, Urine: 7.2 ug/mL

## 2024-09-06 ENCOUNTER — Other Ambulatory Visit: Payer: Self-pay | Admitting: Nurse Practitioner

## 2024-09-06 DIAGNOSIS — E785 Hyperlipidemia, unspecified: Secondary | ICD-10-CM

## 2024-10-02 ENCOUNTER — Telehealth: Payer: Self-pay | Admitting: Nurse Practitioner

## 2024-10-02 NOTE — Telephone Encounter (Signed)
 Pt unconfirmed appt 12/10(per vr ) lvm

## 2024-10-04 ENCOUNTER — Encounter: Payer: Self-pay | Admitting: Nurse Practitioner

## 2024-10-04 ENCOUNTER — Ambulatory Visit: Admitting: Nurse Practitioner

## 2024-10-04 VITALS — BP 136/82 | HR 97 | Ht 72.0 in | Wt 198.4 lb

## 2024-10-04 DIAGNOSIS — E119 Type 2 diabetes mellitus without complications: Secondary | ICD-10-CM

## 2024-10-04 DIAGNOSIS — E1165 Type 2 diabetes mellitus with hyperglycemia: Secondary | ICD-10-CM

## 2024-10-04 DIAGNOSIS — Z13220 Encounter for screening for lipoid disorders: Secondary | ICD-10-CM | POA: Diagnosis not present

## 2024-10-04 DIAGNOSIS — M25512 Pain in left shoulder: Secondary | ICD-10-CM

## 2024-10-04 DIAGNOSIS — Z7984 Long term (current) use of oral hypoglycemic drugs: Secondary | ICD-10-CM | POA: Diagnosis not present

## 2024-10-04 DIAGNOSIS — G8929 Other chronic pain: Secondary | ICD-10-CM

## 2024-10-04 LAB — POCT GLYCOSYLATED HEMOGLOBIN (HGB A1C): Hemoglobin A1C: 8.1 % — AB (ref 4.0–5.6)

## 2024-10-04 MED ORDER — METFORMIN HCL ER 500 MG PO TB24: 1000.0000 mg | ORAL_TABLET | Freq: Two times a day (BID) | ORAL | 1 refills | Status: AC

## 2024-10-04 MED ORDER — MELOXICAM 7.5 MG PO TABS: 7.5000 mg | ORAL_TABLET | Freq: Every day | ORAL | 0 refills | Status: AC

## 2024-10-04 NOTE — Progress Notes (Unsigned)
 Assessment & Plan:  Ramir was seen today for diabetes.  Diagnoses and all orders for this visit:  Type 2 diabetes mellitus with use of oral medications Increase metformin  to 1000 mg BID -     POCT glycosylated hemoglobin (Hb A1C) -     metFORMIN  (GLUCOPHAGE -XR) 500 MG 24 hr tablet; Take 2 tablets (1,000 mg total) by mouth 2 (two) times daily with a meal. Continue blood sugar control as discussed in office today, low carbohydrate diet, and regular physical exercise as tolerated, 150 minutes per week (30 min each day, 5 days per week, or 50 min 3 days per week). Keep blood sugar logs with fasting goal of 90-130 mg/dl, post prandial (after you eat) less than 180.  For Hypoglycemia: BS <60 and Hyperglycemia BS >400; contact the clinic ASAP. Annual eye exams and foot exams are recommended.   Chronic left shoulder pain -     meloxicam  (MOBIC ) 7.5 MG tablet; Take 1 tablet (7.5 mg total) by mouth daily. May alternate with heat and ice application for pain relief. May also alternate with acetaminophen  as prescribed pain relief.   Patient has been counseled on age-appropriate routine health concerns for screening and prevention. These are reviewed and up-to-date. Referrals have been placed accordingly. Immunizations are up-to-date or declined.    Subjective:   Chief Complaint  Patient presents with   Diabetes    Carl Hendricks 63 y.o. male presents to office today for follow up to DM and HTN  He has a past medical history of DM2, Hyperlipidemia, Hypertension, and Seborrheic dermatitis.    DM 2 A1c not at goal and now increased from 7.5 to 8.1 He is currently taking metformin  500 mg with lunch and 1000 mg with dinner and glimepiride  8 mg daily. Notes diet includes breads and sweets which he is overmconsuming.  Lab Results  Component Value Date   HGBA1C 8.1 (A) 10/04/2024    Lab Results  Component Value Date   HGBA1C 7.5 (A) 06/28/2024   LDL at goal with atorvastatin  10 mg  daily Lab Results  Component Value Date   LDLCALC 57 10/05/2023    He has chronic left shoulder pain. Unrelated to any injury. His job does require repetitive use of his arms and hands. Has taken meloxicam  in the past for knee pain and reports relief of his joint pain. Will fill today for his shoulder pain.     Review of Systems  Constitutional:  Negative for fever, malaise/fatigue and weight loss.  HENT: Negative.  Negative for nosebleeds.   Eyes: Negative.  Negative for blurred vision, double vision and photophobia.  Respiratory: Negative.  Negative for cough and shortness of breath.   Cardiovascular: Negative.  Negative for chest pain, palpitations and leg swelling.  Gastrointestinal: Negative.  Negative for heartburn, nausea and vomiting.  Musculoskeletal:  Positive for joint pain. Negative for myalgias.  Neurological: Negative.  Negative for dizziness, focal weakness, seizures and headaches.  Psychiatric/Behavioral: Negative.  Negative for suicidal ideas.     Past Medical History:  Diagnosis Date   Diabetes mellitus without complication (HCC)    Hyperlipidemia    Hypertension    Seborrheic dermatitis     History reviewed. No pertinent surgical history.  Family History  Problem Relation Age of Onset   Diabetes Mother    Diabetes Father    Diabetes Sister     Social History Reviewed with no changes to be made today.   Outpatient Medications Prior to Visit  Medication  Sig Dispense Refill   atorvastatin  (LIPITOR) 10 MG tablet TAKE 1 TABLET BY MOUTH DAILY. FOR CHOLESTEROL 90 tablet 1   Clobetasol  Propionate 0.05 % lotion Apply 2 times weekly to scalp and dry areas 59 mL 0   glimepiride  (AMARYL ) 4 MG tablet Take 2 tablets with your lunch. Please fill as a 90 day supply 180 tablet 1   ketoconazole  (NIZORAL ) 2 % shampoo Apply 1 Application topically 2 (two) times a week. 240 mL 3   lisinopril  (ZESTRIL ) 10 MG tablet Take 1 tablet (10 mg total) by mouth daily. For blood  pressure. Please fill as a 90 day supply 90 tablet 1   metFORMIN  (GLUCOPHAGE ) 500 MG tablet Take 1 tablet with lunch and 2 tablets with dinner 270 tablet 1   Accu-Chek Softclix Lancets lancets Use to check blood sugar once daily. (Patient not taking: Reported on 10/04/2024) 100 each 2   glucose blood (ACCU-CHEK GUIDE) test strip Use as instructed. Check blood glucose by fingerstick once per day. E11.65 (Patient not taking: Reported on 10/04/2024) 100 each 12   Misc. Devices MISC Please provide patient with insurance approved glucometer, strips and lancets. E11.65. Instructions: monitor blood glucose levels twice daily. Strips quantity 100 with 6 refills. Lancets quantity 100 with 6 refills. (Patient not taking: Reported on 10/04/2024) 1 each 0   No facility-administered medications prior to visit.    Allergies[1]     Objective:    BP 136/82 (BP Location: Left Arm, Patient Position: Sitting, Cuff Size: Normal)   Pulse 97   Ht 6' (1.829 m)   Wt 198 lb 6.4 oz (90 kg)   SpO2 97%   BMI 26.91 kg/m  Wt Readings from Last 3 Encounters:  10/04/24 198 lb 6.4 oz (90 kg)  06/28/24 196 lb (88.9 kg)  01/05/24 198 lb (89.8 kg)    Physical Exam Vitals and nursing note reviewed.  Constitutional:      Appearance: He is well-developed.  HENT:     Head: Normocephalic and atraumatic.  Cardiovascular:     Rate and Rhythm: Normal rate and regular rhythm.     Heart sounds: Normal heart sounds. No murmur heard.    No friction rub. No gallop.  Pulmonary:     Effort: Pulmonary effort is normal. No tachypnea or respiratory distress.     Breath sounds: Normal breath sounds. No decreased breath sounds, wheezing, rhonchi or rales.  Chest:     Chest wall: No tenderness.  Musculoskeletal:        General: Normal range of motion.     Cervical back: Normal range of motion.  Skin:    General: Skin is warm and dry.  Neurological:     Mental Status: He is alert and oriented to person, place, and time.      Coordination: Coordination normal.  Psychiatric:        Behavior: Behavior normal. Behavior is cooperative.        Thought Content: Thought content normal.        Judgment: Judgment normal.          Patient has been counseled extensively about nutrition and exercise as well as the importance of adherence with medications and regular follow-up. The patient was given clear instructions to go to ER or return to medical center if symptoms don't improve, worsen or new problems develop. The patient verbalized understanding.   Follow-up: Return in about 14 weeks (around 01/10/2025).   Haze LELON Servant, FNP-BC [facility]  [place], [place] [phone]   10/04/2024, 4:24 PM     [1] No Known Allergies

## 2024-10-05 LAB — CMP14+EGFR
ALT: 36 IU/L (ref 0–44)
AST: 37 IU/L (ref 0–40)
Albumin: 4.6 g/dL (ref 3.9–4.9)
Alkaline Phosphatase: 114 IU/L (ref 47–123)
BUN/Creatinine Ratio: 14 (ref 10–24)
BUN: 17 mg/dL (ref 8–27)
Bilirubin Total: 0.6 mg/dL (ref 0.0–1.2)
CO2: 26 mmol/L (ref 20–29)
Calcium: 9.7 mg/dL (ref 8.6–10.2)
Chloride: 100 mmol/L (ref 96–106)
Creatinine, Ser: 1.21 mg/dL (ref 0.76–1.27)
Globulin, Total: 2.5 g/dL (ref 1.5–4.5)
Glucose: 71 mg/dL (ref 70–99)
Potassium: 4.3 mmol/L (ref 3.5–5.2)
Sodium: 140 mmol/L (ref 134–144)
Total Protein: 7.1 g/dL (ref 6.0–8.5)
eGFR: 67 mL/min/1.73 (ref >59)

## 2024-10-07 ENCOUNTER — Ambulatory Visit: Payer: Self-pay | Admitting: Nurse Practitioner

## 2025-01-10 ENCOUNTER — Ambulatory Visit: Payer: Self-pay | Admitting: Nurse Practitioner
